# Patient Record
Sex: Female | Born: 1975 | Race: Asian | Hispanic: No | Marital: Married | Smoking: Never smoker
Health system: Southern US, Community
[De-identification: ages and names within clinical notes are randomized; demographics above are authoritative.]

## PROBLEM LIST (undated history)

## (undated) DIAGNOSIS — Q250 Patent ductus arteriosus: Secondary | ICD-10-CM

## (undated) DIAGNOSIS — F419 Anxiety disorder, unspecified: Secondary | ICD-10-CM

## (undated) DIAGNOSIS — E039 Hypothyroidism, unspecified: Secondary | ICD-10-CM

## (undated) DIAGNOSIS — I471 Supraventricular tachycardia, unspecified: Secondary | ICD-10-CM

## (undated) DIAGNOSIS — D649 Anemia, unspecified: Secondary | ICD-10-CM

## (undated) HISTORY — DX: Supraventricular tachycardia: I47.1

## (undated) HISTORY — DX: Hypothyroidism, unspecified: E03.9

## (undated) HISTORY — DX: Anxiety disorder, unspecified: F41.9

## (undated) HISTORY — DX: Supraventricular tachycardia, unspecified: I47.10

## (undated) HISTORY — DX: Patent ductus arteriosus: Q25.0

## (undated) HISTORY — DX: Anemia, unspecified: D64.9

---

## 2006-01-13 ENCOUNTER — Ambulatory Visit (HOSPITAL_COMMUNITY): Admission: RE | Admit: 2006-01-13 | Discharge: 2006-01-13 | Payer: Self-pay | Admitting: Obstetrics & Gynecology

## 2006-03-21 IMAGING — US US OB TRANSVAGINAL
1 series · 13 of 26 positions shown · non-contrast
Comparison: none

CLINICAL DATA: Missed AB.  Evaluate for possible retained products of conception.  The patient has passed tissue and is currently bleeding. 
TRANSVAGINAL OBSTETRICAL US:
TECHNIQUE: Transvaginal ultrasound was performed for evaluation of the gestation as well as the maternal uterus and adnexal regions.

[Series 1: us ob transvaginal · 0.15mm/px · 13 of 26 slices shown]
[im 2/26]
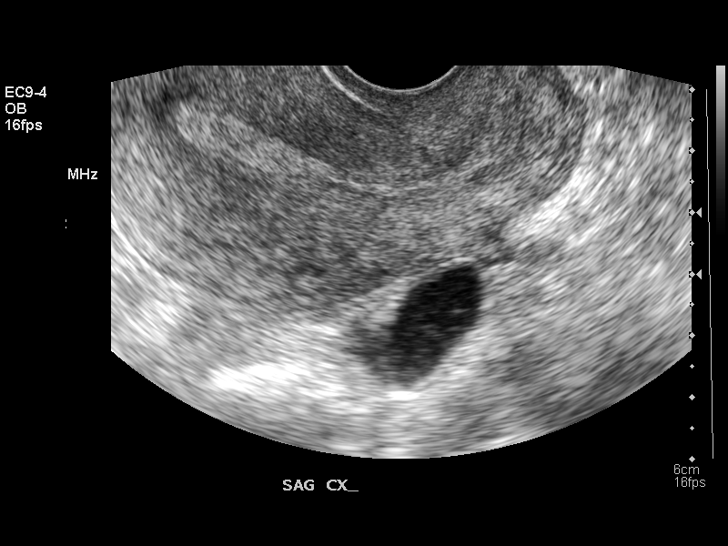
[im 4/26]
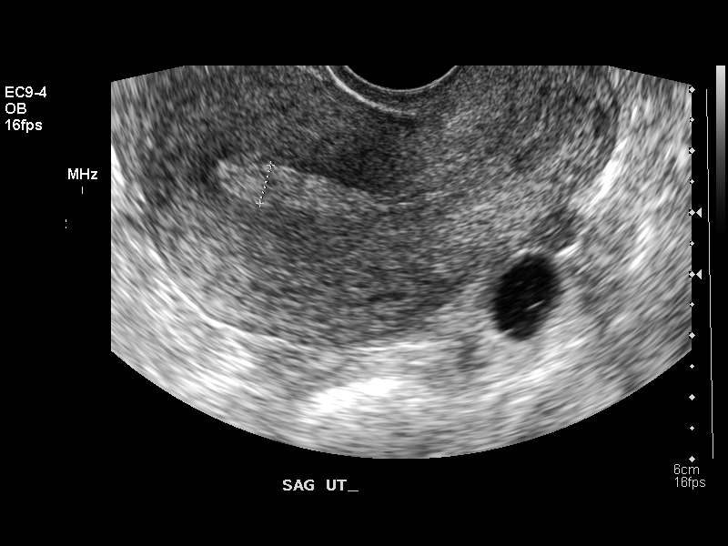
[im 6/26]
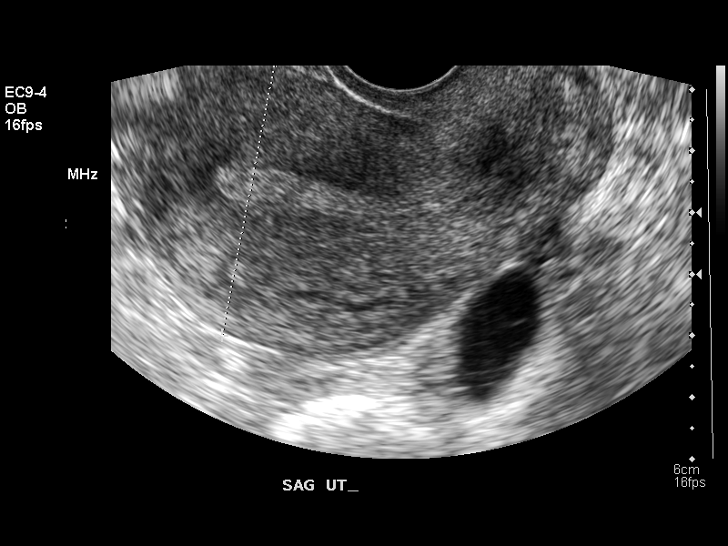
[im 8/26]
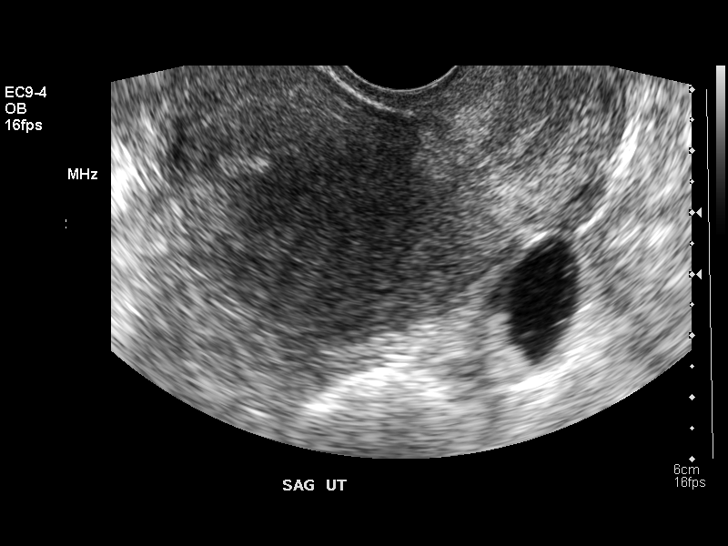
[im 10/26]
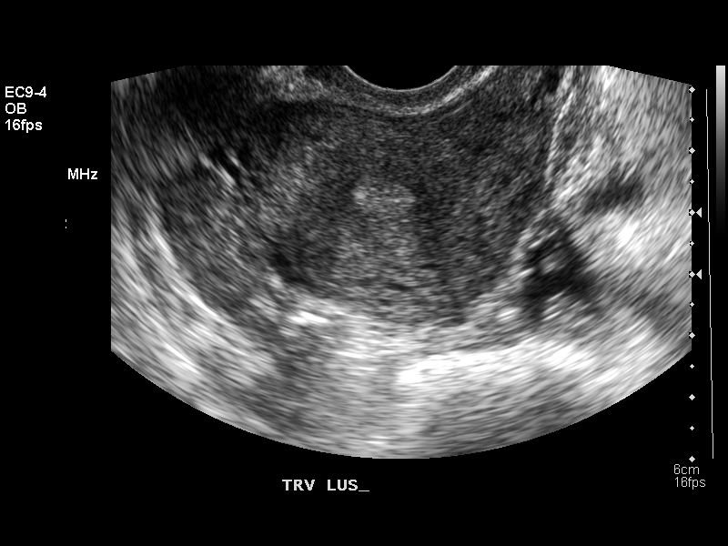
[im 12/26]
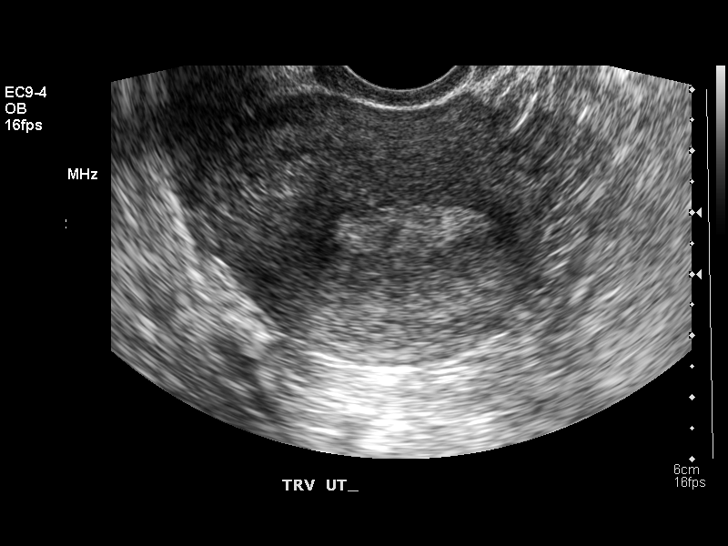
[im 14/26]
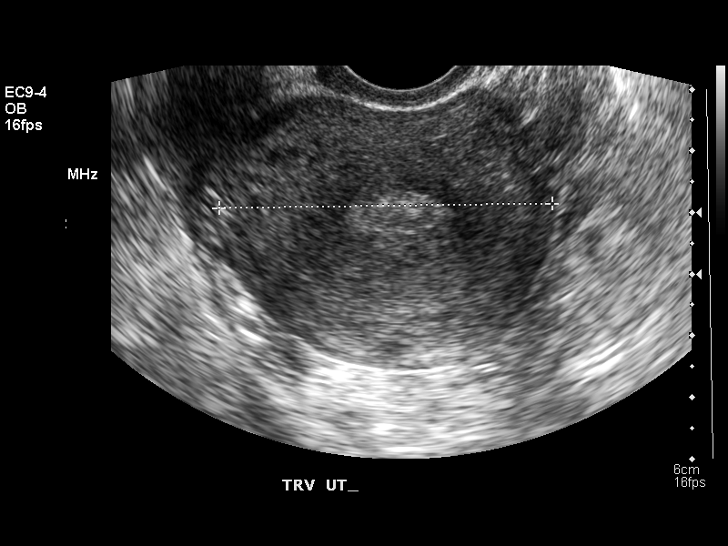
[im 16/26]
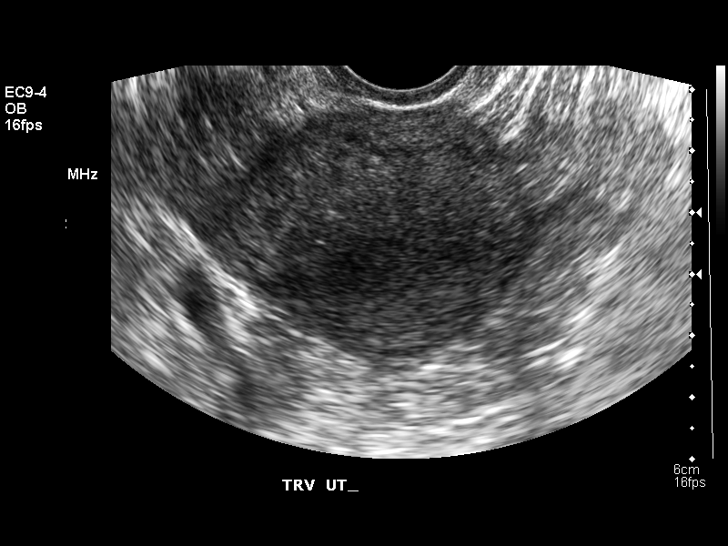
[im 18/26]
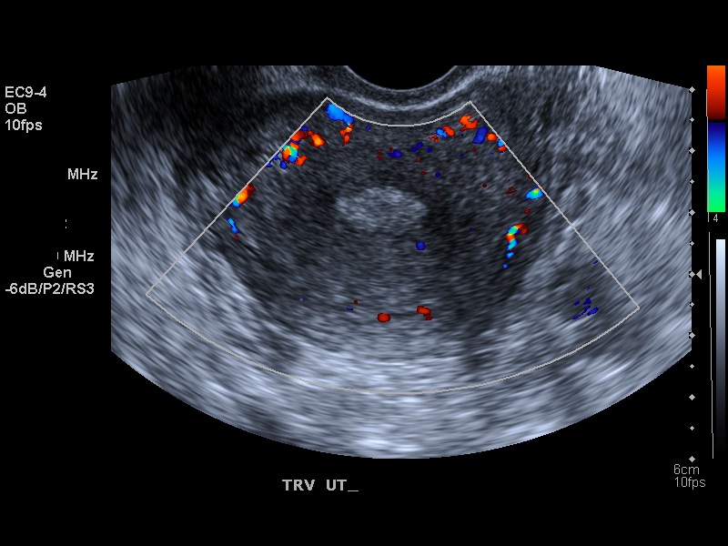
[im 20/26]
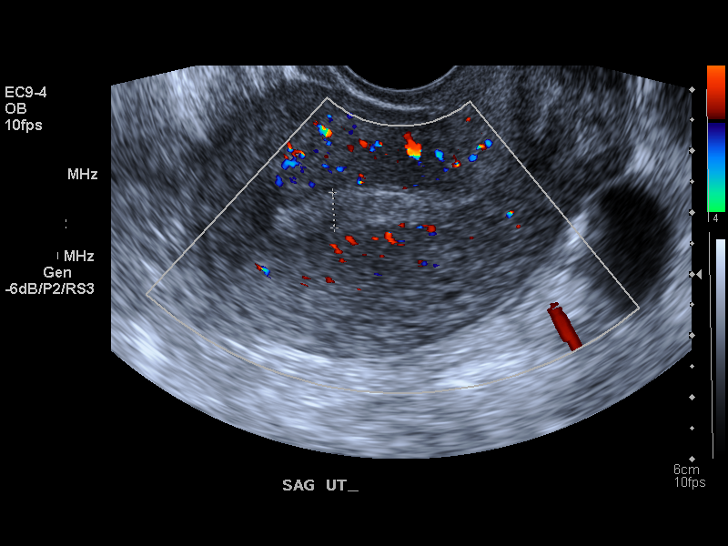
[im 22/26]
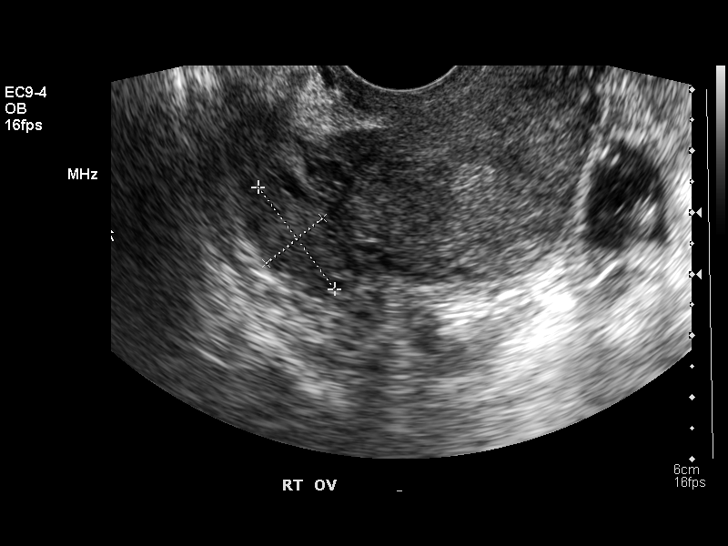
[im 24/26]
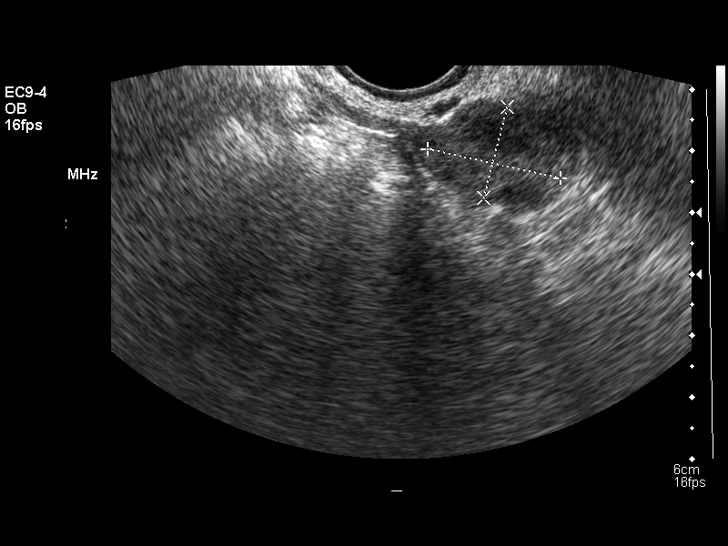
[im 26/26]
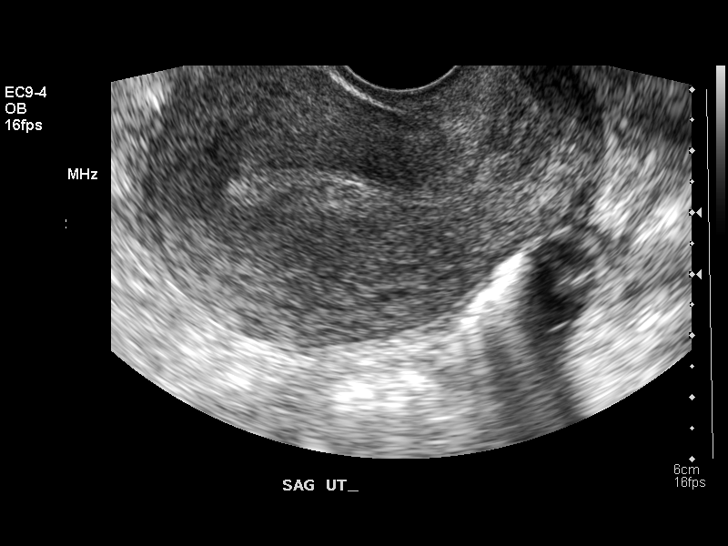

[13 of 26 positions shown; findings below may reference images not displayed]

FINDINGS: Multiple images of the uterus and adnexa were obtained using an endovaginal approach.
The uterus has a sagittal length of 8.3 cm, AP width of 4.6, and a transverse width of 6.0 cm.  A homogeneous uterine myometrium is seen.  The endometrial canal is homogeneous echogenic with an AP width of 5.8 mm.  No evidence for color flow activity was identified within the endometrial canal suggesting that this represents some blood within the endometrial canal in this patient experiencing current bleeding.  No area of focal thickening or inhomogeniety, or abnormal flow was seen to suggest sonographically the presence of retained products of conception.  
Both ovaries are seen with the left ovary measuring 2.2 x 1.5 x 1.6 cm and the right ovary measuring 1.0 x 2.1 x 1.2 cm.  No cul-de-sac or periovarian fluid is seen and no separate adnexal masses are noted.
IMPRESSION: No sonographic stigmata suggestive of retained products of conception.  Normal ovaries.

## 2006-11-19 ENCOUNTER — Inpatient Hospital Stay (HOSPITAL_COMMUNITY): Admission: AD | Admit: 2006-11-19 | Discharge: 2006-11-22 | Payer: Self-pay | Admitting: Obstetrics and Gynecology

## 2007-01-08 ENCOUNTER — Encounter: Admission: RE | Admit: 2007-01-08 | Discharge: 2007-01-08 | Payer: Self-pay | Admitting: Obstetrics and Gynecology

## 2007-03-06 ENCOUNTER — Emergency Department (HOSPITAL_COMMUNITY): Admission: EM | Admit: 2007-03-06 | Discharge: 2007-03-06 | Payer: Self-pay | Admitting: Emergency Medicine

## 2007-03-16 IMAGING — US US EXTREM LOW VENOUS*L*
1 series · 14 of 24 positions shown · non-contrast
Comparison: none

CLINICAL DATA: Left leg pain and swelling, fell 1 week ago.

 Left  lower extremity venous Doppler ultrasound:
TECHNIQUE: Gray-scale sonography with compression as well as color and duplex
Doppler ultrasound were performed to evaluate the deep venous system from the
level of the common femoral vein through the popliteal and proximal calf veins.

[Series 1: unknown · 14 of 34 slices shown]
[im 1/34]
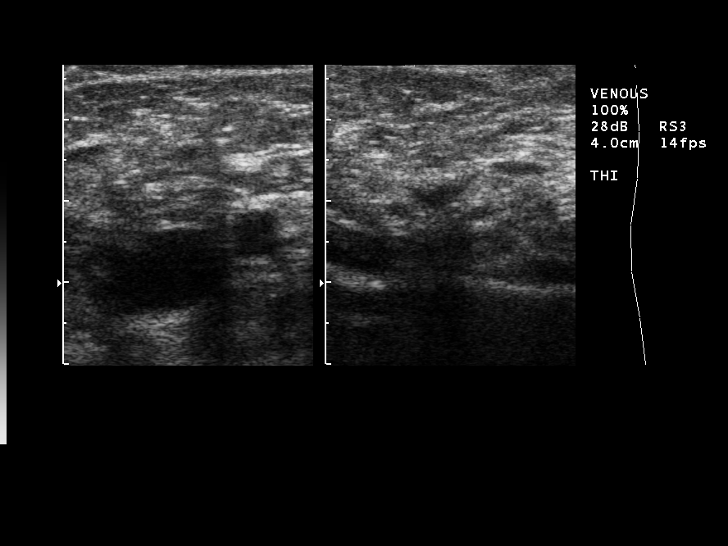
[im 3/34]
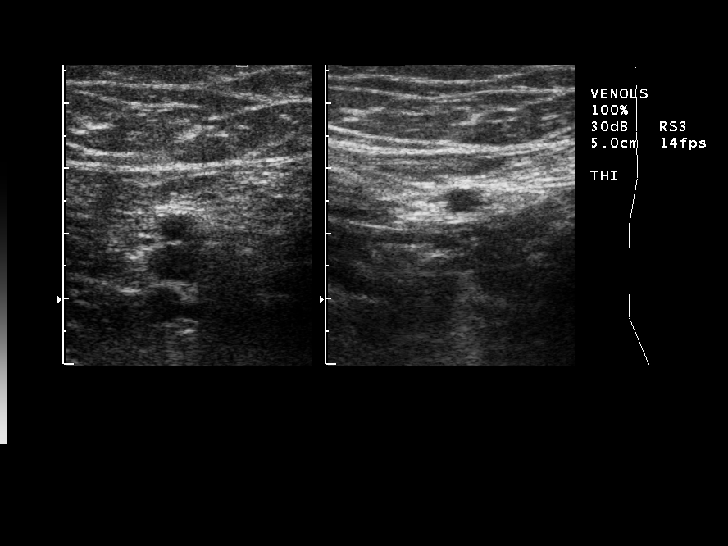
[im 6/34]
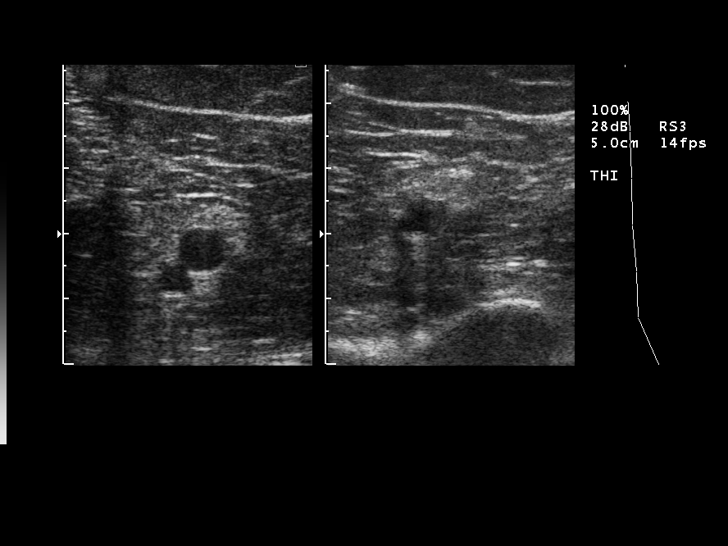
[im 9/34]
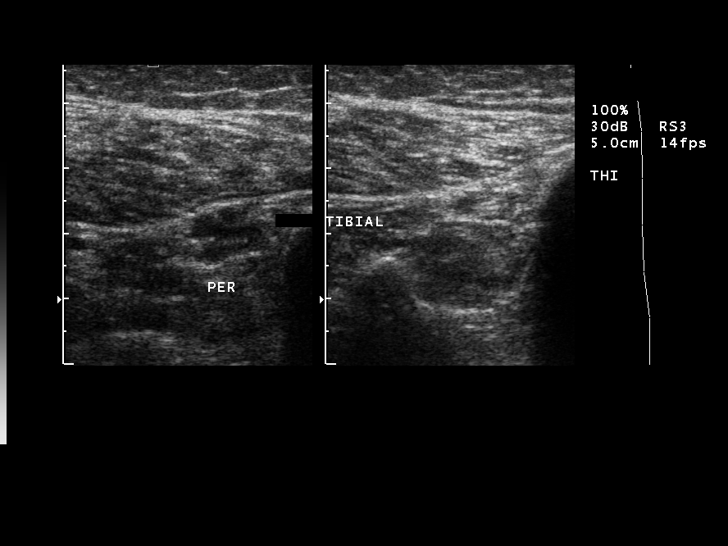
[im 11/34]
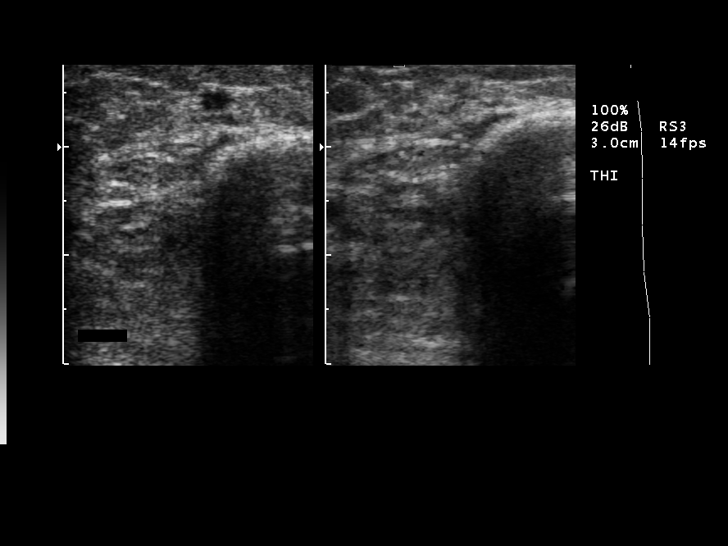
[im 13/34]
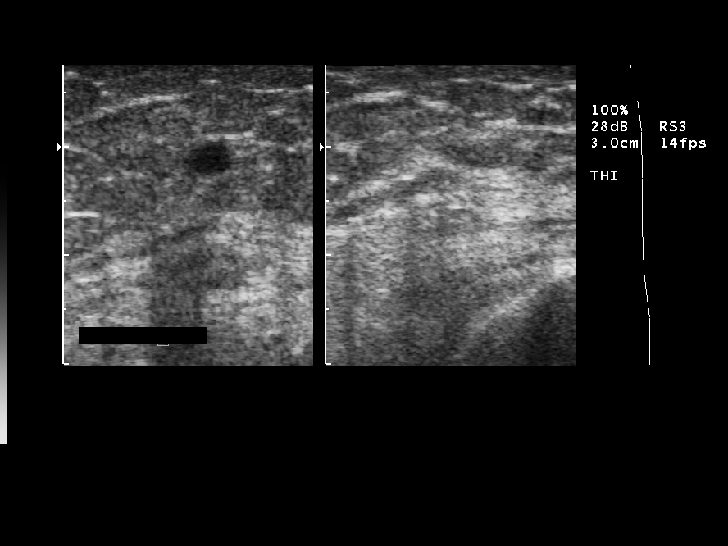
[im 16/34]
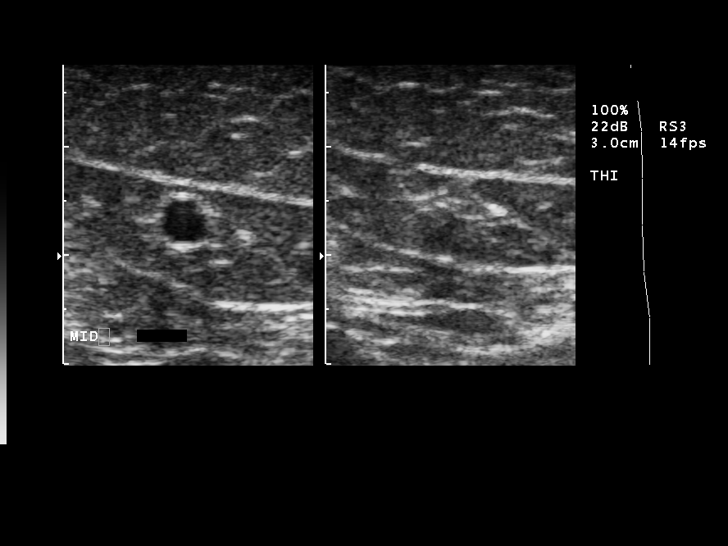
[im 18/34]
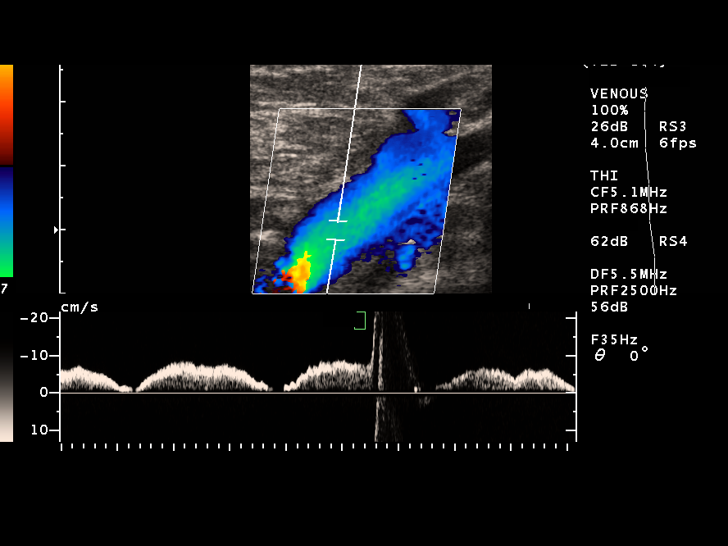
[im 21/34]
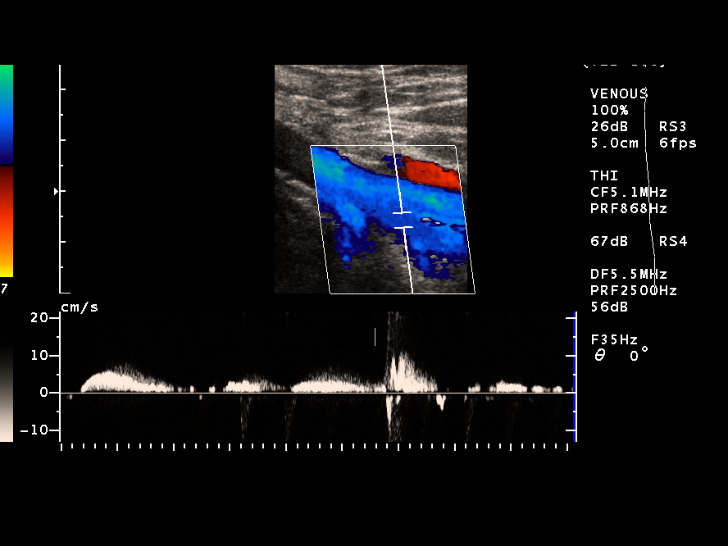
[im 23/34]
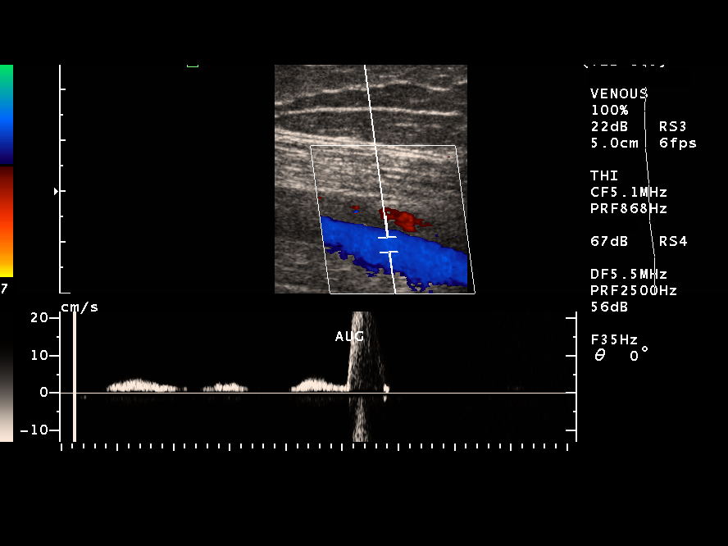
[im 26/34]
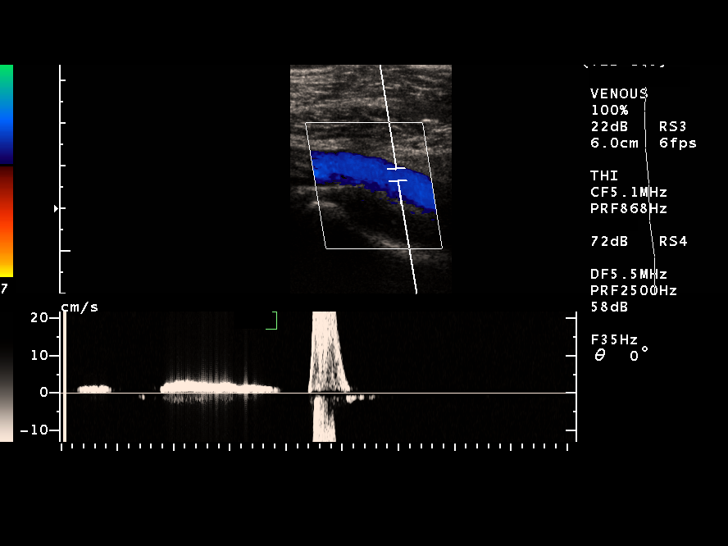
[im 28/34]
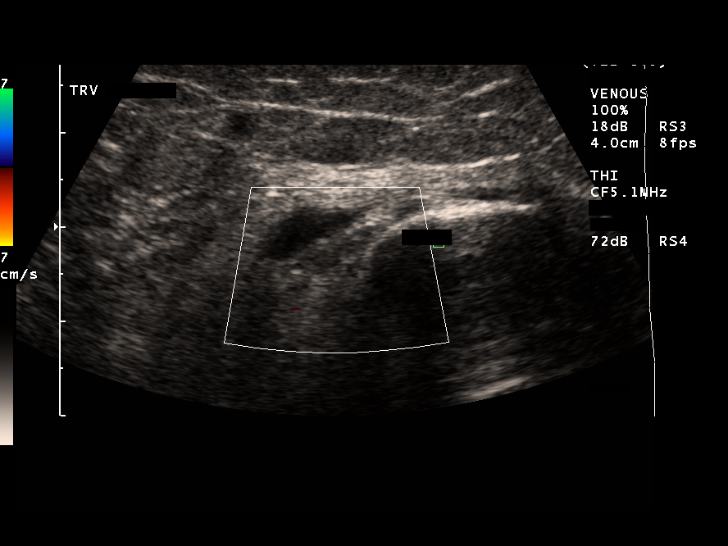
[im 31/34]
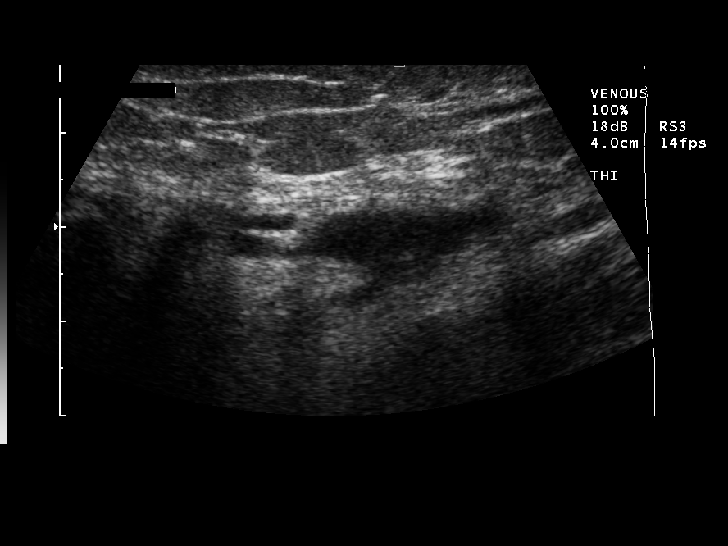
[im 34/34]
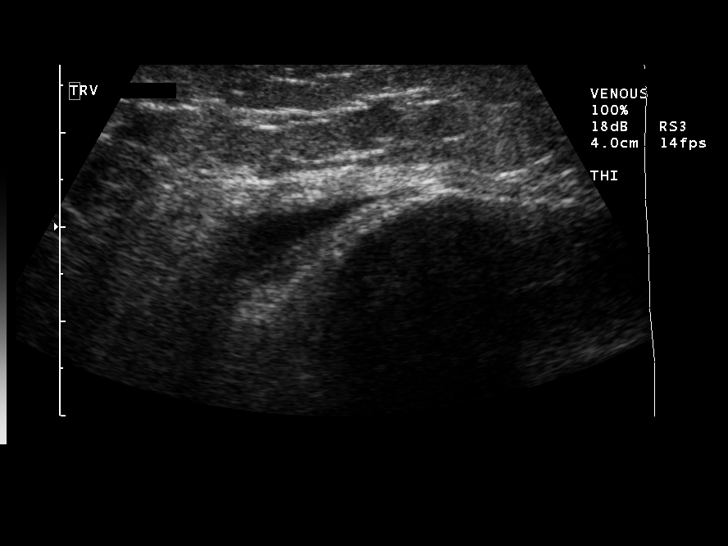

[14 of 24 positions shown; findings below may reference images not displayed]

FINDINGS: The lower extremity deep venous system demonstrates normal
compressibility, phasicity, and augmentation. Posterior tibial vein
unremarkable.  No evidence of DVT. There is a deep subcutaneous 9 x 10 x 33 mm
fluid collection anteromedial to the tibial shaft in the region of patient's
symptoms.
IMPRESSION: 1. Negative for left  lower extremity DVT.
2. Elongated fluid collection in the subcutaneous tissues anteromedial to the
tibial shaft, possibly related to previous fall.

## 2008-12-02 DIAGNOSIS — Q250 Patent ductus arteriosus: Secondary | ICD-10-CM

## 2008-12-02 HISTORY — DX: Patent ductus arteriosus: Q25.0

## 2009-04-01 ENCOUNTER — Inpatient Hospital Stay (HOSPITAL_COMMUNITY): Admission: AD | Admit: 2009-04-01 | Discharge: 2009-04-01 | Payer: Self-pay | Admitting: Obstetrics & Gynecology

## 2009-04-03 ENCOUNTER — Inpatient Hospital Stay (HOSPITAL_COMMUNITY): Admission: AD | Admit: 2009-04-03 | Discharge: 2009-04-05 | Payer: Self-pay | Admitting: Obstetrics and Gynecology

## 2009-05-03 ENCOUNTER — Encounter: Payer: Self-pay | Admitting: Internal Medicine

## 2009-06-01 ENCOUNTER — Encounter: Payer: Self-pay | Admitting: Internal Medicine

## 2009-06-07 IMAGING — US US FETAL BPP W/O NONSTRESS
1 series · 14 of 19 positions shown · non-contrast
Comparison: none

OBSTETRICAL ULTRASOUND:
 This ultrasound exam was performed in the [HOSPITAL] Ultrasound Department.  The OB US report was generated in the AS system, and faxed to the ordering physician.  This report is also available in [REDACTED] PACS.

[Series 1: us fetal bpp w/o nonstress · non-contrast · 19 acquisitions, 14 frames shown]
[im 1/19]
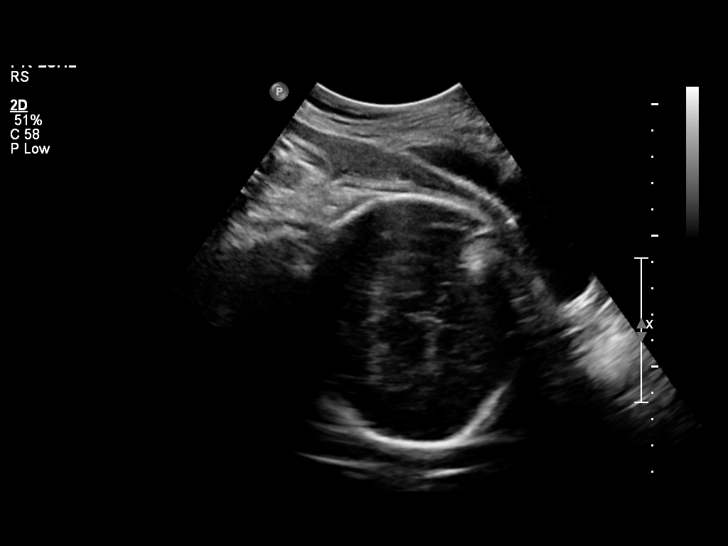
[im 3/19]
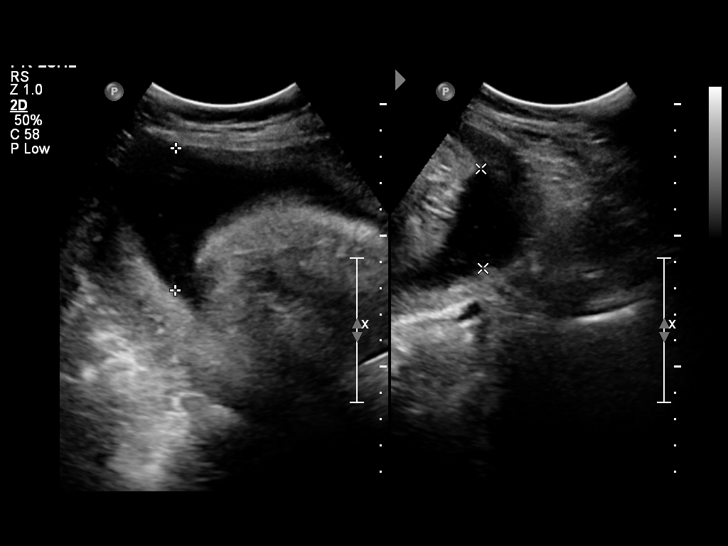
[im 4/19]
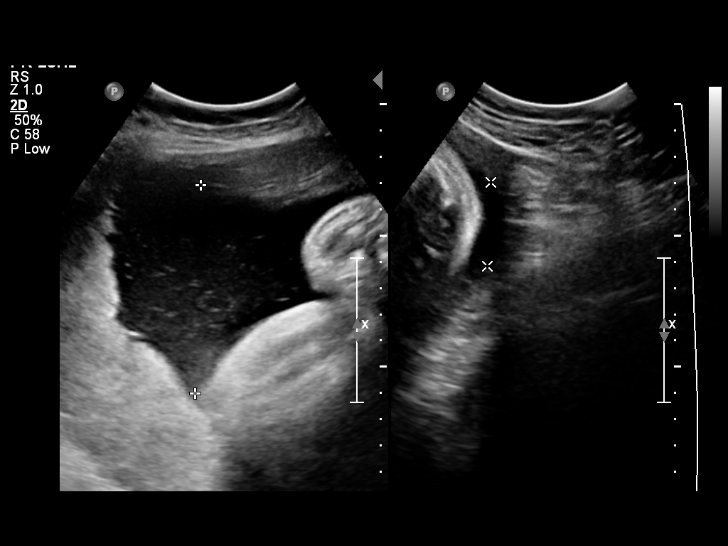
[im 5/19]
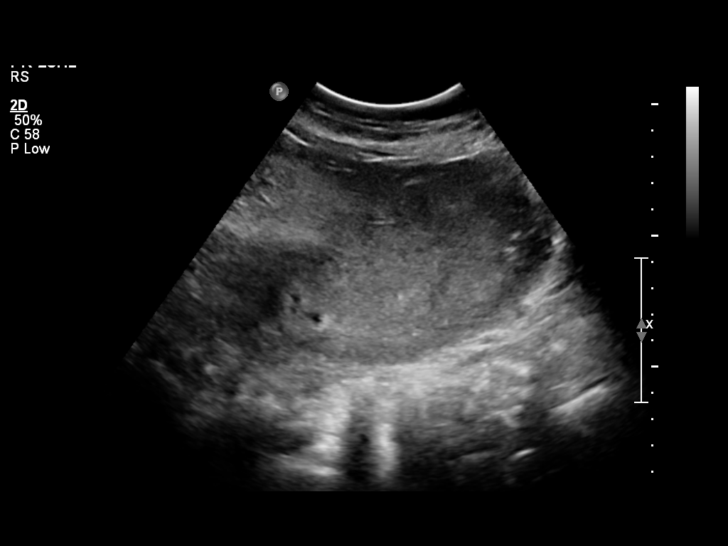
[im 7/19]
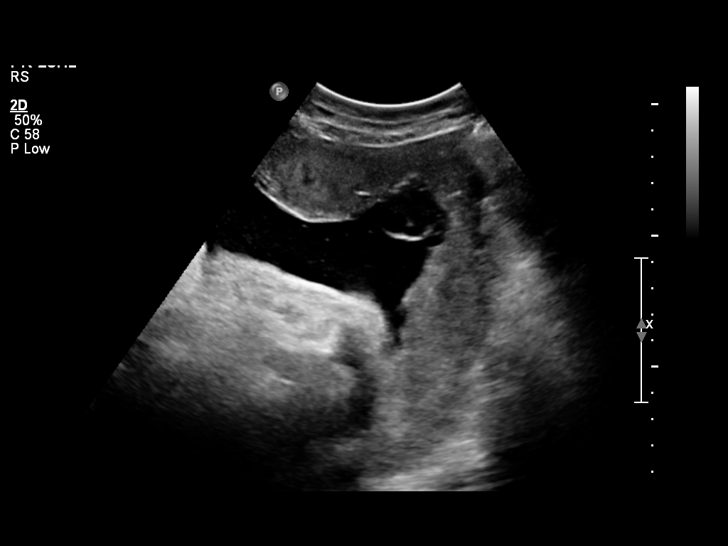
[im 8/19]
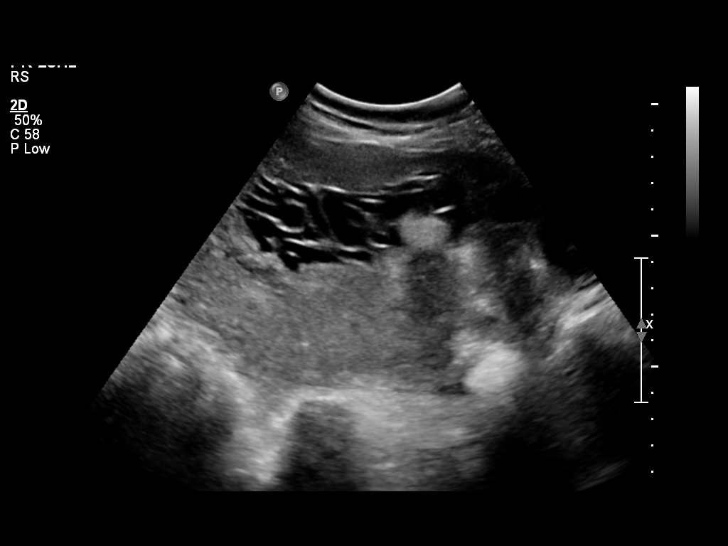
[im 9/19]
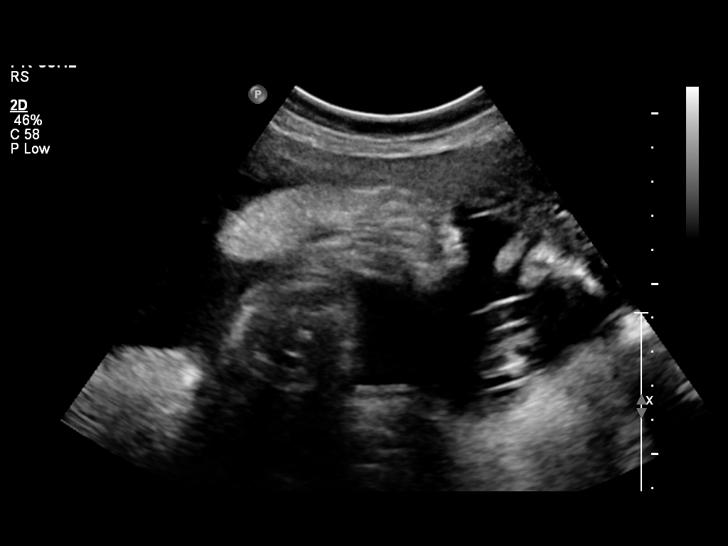
[im 11/19]
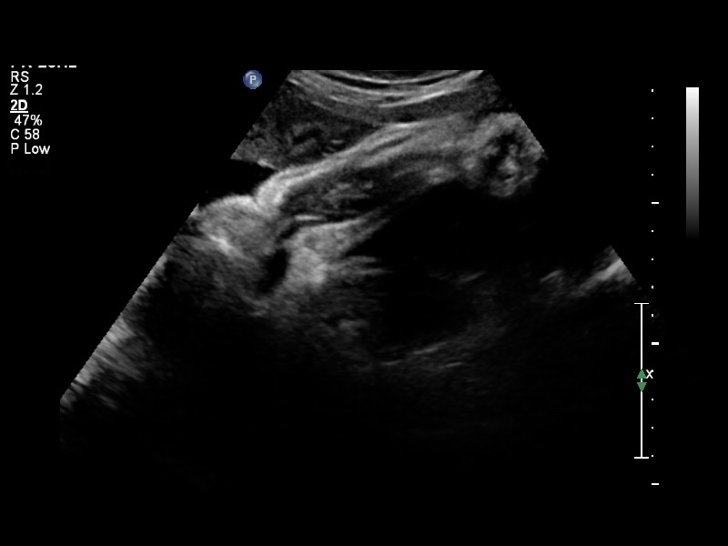
[im 12/19]
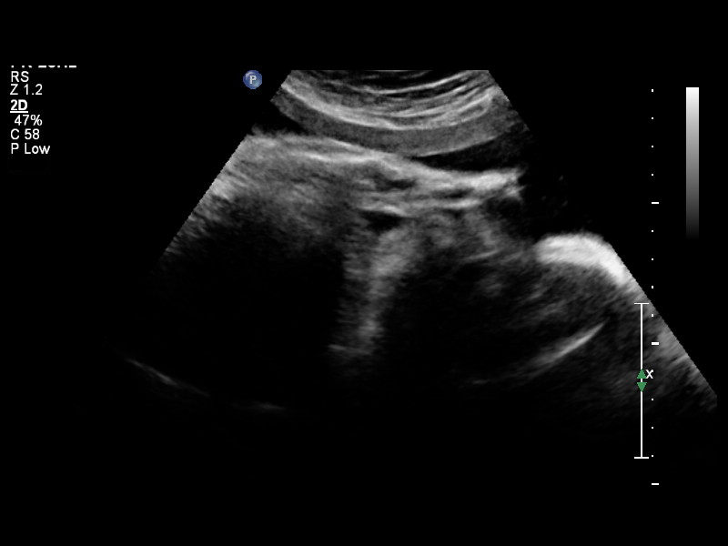
[im 13/19]
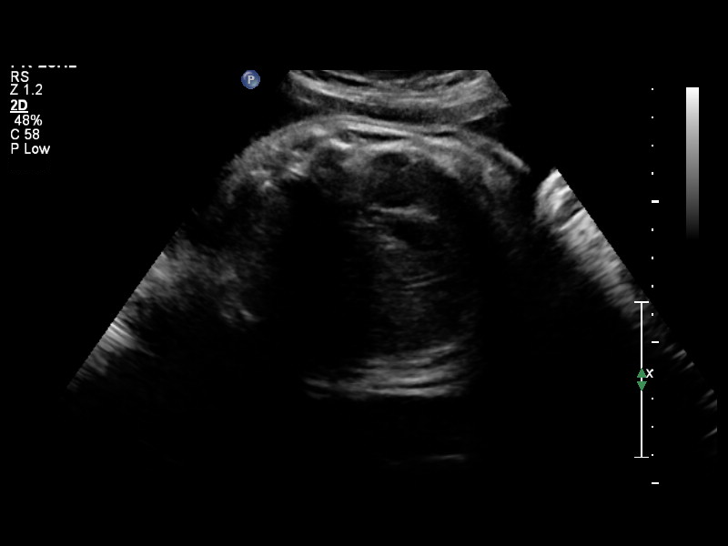
[im 15/19]
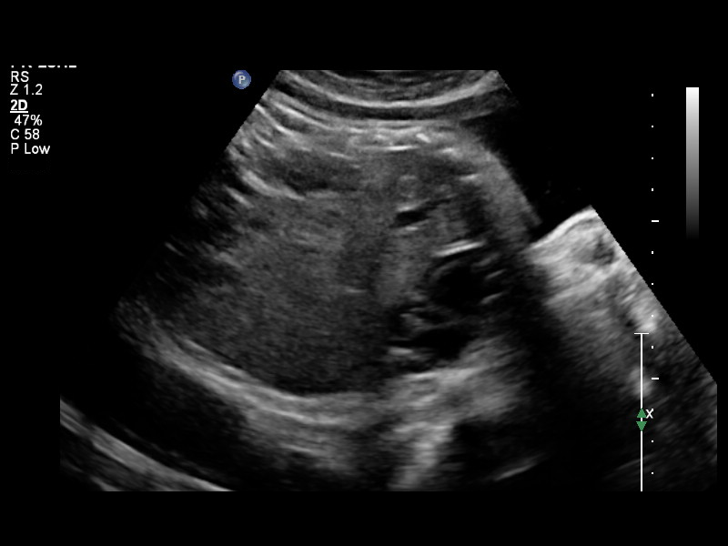
[im 16/19]
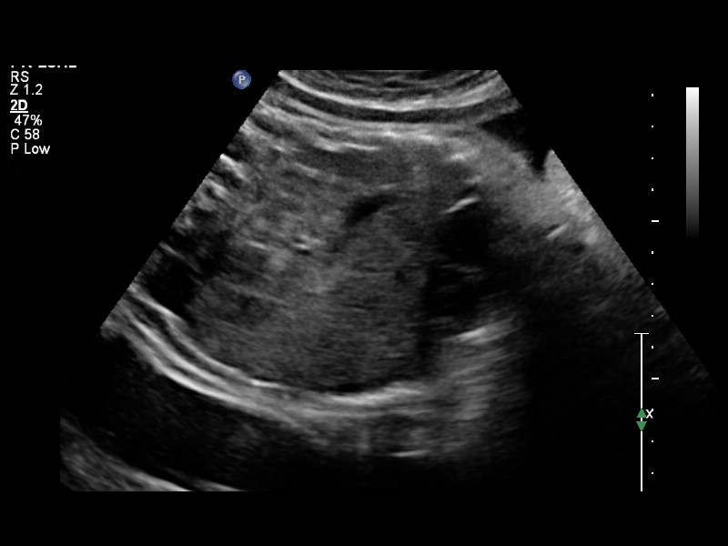
[im 17/19]
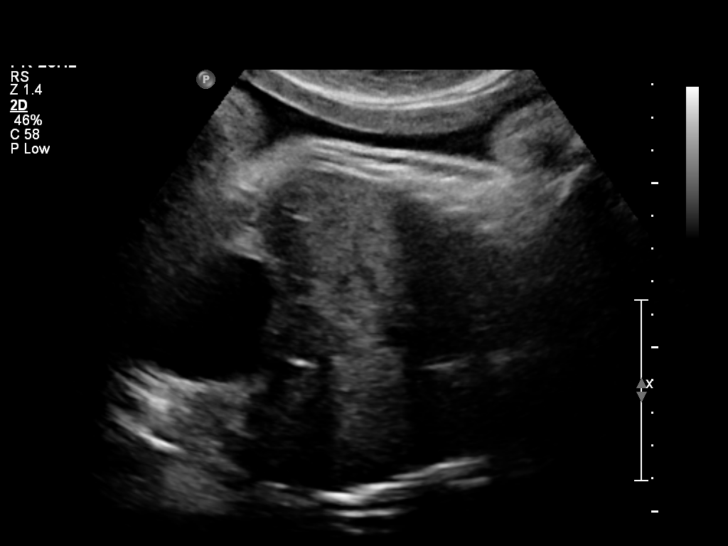
[im 19/19]
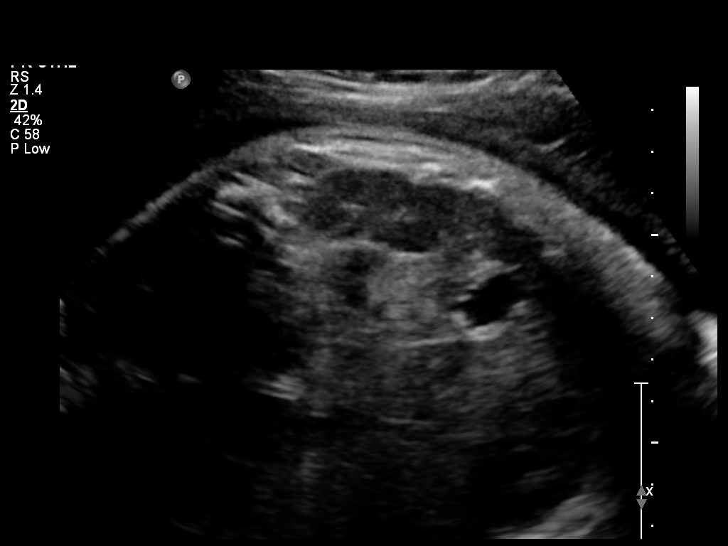

[14 of 19 positions shown; findings below may reference images not displayed]

IMPRESSION: See AS Obstetric US report.

## 2009-11-15 ENCOUNTER — Encounter: Payer: Self-pay | Admitting: Internal Medicine

## 2009-12-18 DIAGNOSIS — I498 Other specified cardiac arrhythmias: Secondary | ICD-10-CM

## 2009-12-18 DIAGNOSIS — Q250 Patent ductus arteriosus: Secondary | ICD-10-CM

## 2009-12-19 ENCOUNTER — Ambulatory Visit: Payer: Self-pay | Admitting: Internal Medicine

## 2009-12-19 DIAGNOSIS — E0789 Other specified disorders of thyroid: Secondary | ICD-10-CM | POA: Insufficient documentation

## 2010-04-26 ENCOUNTER — Telehealth (INDEPENDENT_AMBULATORY_CARE_PROVIDER_SITE_OTHER): Payer: Self-pay | Admitting: *Deleted

## 2010-05-09 ENCOUNTER — Telehealth: Payer: Self-pay | Admitting: Internal Medicine

## 2010-05-31 ENCOUNTER — Ambulatory Visit: Payer: Self-pay | Admitting: Internal Medicine

## 2010-08-31 ENCOUNTER — Ambulatory Visit: Payer: Self-pay | Admitting: Internal Medicine

## 2010-09-17 ENCOUNTER — Telehealth: Payer: Self-pay | Admitting: Internal Medicine

## 2010-09-19 ENCOUNTER — Telehealth: Payer: Self-pay | Admitting: Internal Medicine

## 2010-11-27 ENCOUNTER — Encounter: Payer: Self-pay | Admitting: Internal Medicine

## 2010-11-27 ENCOUNTER — Ambulatory Visit: Payer: Self-pay | Admitting: Internal Medicine

## 2010-12-24 ENCOUNTER — Encounter: Payer: Self-pay | Admitting: Interventional Cardiology

## 2011-01-01 NOTE — Progress Notes (Signed)
Summary: pt has been having pappitations and SVT  Phone Note Call from Patient Call back at Home Phone [phone]   Caller: Spouse Reason for Call: Talk to Nurse, Talk to Doctor Summary of Call: pt has been having palpitations and SVT and wants a sooner appt. He also wants to change to Dr. Graciela Husbands. Initial call taken by: Omer Jack,  Apr 26, 2010 1:53 PM  Follow-up for Phone Call        Called pt and spoke with husband.  She had an episode Mon.  Lasted 2 min.  Nothing like prior events.  Original appointment was made for Dr Graciela Husbands and they want to see him.  He was not avaliable then.  I let them know I would foward to Dr Odessa Fleming nurse and she will give them a call back if this is possible Dennis Bast, RN, BSN  Apr 26, 2010 5:12 PM  Additional Follow-up for Phone Call Additional follow up Details #1::        Sent to schedulers, ok to schedule with Graciela Husbands. Gypsy Balsam RN BSN  May 01, 2010 8:20 AM   appt 7/5 @ 12:00 with Graciela Husbands sent appt card Asher Muir Little  May 01, 2010 9:51 AM

## 2011-01-01 NOTE — Letter (Signed)
[facility]  [facility] Progress Note   Imported By: Roderic Ovens 12/22/2009 10:47:15  _____________________________________________________________________  External Attachment:    Type:   Image     Comment:   External Document

## 2011-01-01 NOTE — Assessment & Plan Note (Signed)
Summary: ec6/ok per amber/jml   Primary Provider:  Talmage Nap, MD   History of Present Illness: Katie Howard is referred today by Dr. 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  Katie Howard has a h/o thyroid dysfunction and is currently on thyroid replacement.    Katie Howard initial episode of SVT occurred approximately 3 yrs ago after Katie birth of Katie Howard. Katie Howard heartrate was apparently 240 beats per minute and it terminated abruptly prior to being given any medication. Katie Howard was I think then put on labetalol which he has tolerated poorly because of fatigue.  Since then Katie Howard has had 2 episodes which are similar to Katie initial period they occurred most recently about a year ago with 2 episodes in a month.  Katie Howard also however has had multiple other episodes associated with similar but distinct symptoms. Katie similarities include pain in Katie Howard and Katie Howard. Katie Howard is however with these other episodes not presyncopal and does not have shortness of breath. Katie Howard feels like Katie Howard heart rate with these other episodes is approximately 120. They frequently occur at night.  Both Katie Howard and Katie Howard husband are very expressive of Katie degree of anxiety that is part of their daily life. This is particularly aggravated when Katie husband goes on trips as his wife does not drive and Katie Howard is poor.  these episodes are fraught with negative diuretic negative.  Current Medications (verified): 1)  Labetalol Hcl 100 Mg Tabs (Labetalol Hcl) .Marland Kitchen.. 1 By Mouth Daily 2)  Armour Thyroid 60 Mg Tabs (Thyroid) .Marland Kitchen.. 1 By Mouth Daily 3)  Ferrous Sulfate 325 (65 Fe) Mg Tabs (Ferrous Sulfate) .... Take One Tablet By Mouth Once Daily. 4)  Armour Thyroid 15 Mg Tabs (Thyroid) .Marland Kitchen.. 1 By Mouth Dialy With 60mg   Allergies (verified): 1)  ! Sulfa  Past History:  Past Medical History: SVT Patent ductus arteriosus suspected per echocardiogram 2010 unspecified anemia hypothyroidism anxiety  Review of Systems       full review of systems was  negative apart from a history of present illness and past medical history.   Vital Signs:  Howard profile:   35 year old female Height:      65 inches Weight:      143 pounds BMI:     23.88 Pulse rate:   80 / minute Resp:     16 per minute BP sitting:   95 / 69  (right arm)  Vitals Entered By: Marrion Coy, CNA (May 31, 2010 9:25 AM)  Physical Exam  General:  Well developed, well nourished, in no acute distress.  HEENT: normal Neck: supple. No JVD. Carotids 2+ bilaterally no bruits Cor: RRR no rubs, gallops or murmur Lungs: CTA Ab: soft, nontender. nondistended. No HSM. Good bowel sounds Ext: warm. no cyanosis, clubbing or edema Neuro: alert and oriented. Grossly nonfocal. affect pleasant    EKG  Procedure date:  05/31/2010  Findings:      sinus rhythm at 80 Intervals 0.16/2007/0.38 Axis is 110 Poor R-wave progression No evidence of preexcitation  Impression & Recommendations:  Problem # 1:  SUPRAVENTRICULAR TACHYCARDIA (ICD-427.89) Katie Howard has documented supraventricular tachycardia at a cycle length of approximately 260 ms. There is no evidence of pseudo-R. prime or pseudo-Q waves on Katie tracing; this and Katie Howard symptoms are most consistent then with AV reentry notwithstanding demographics.  We discussed treatment options including medications and or catheter ablation. We discussed Katie risks of Katie latter; Katie Howard would like to defer this at present.  we then  came to Katie idea of using Rythmol; we discussed potential antiarrhythmic benefits as well as pro arrhythmic risk  He has not tolerated labetalol well because of fatigue; hopefully that'll pass known will be better. It is less likely I think to cause hypotension which may be contributing to Katie Howard fatigue.  Katie Howard also has palpitations which I suspect are not SVT and may represent panic disorder. There have been discussions about SSRI therapy. I have encouraged Katie Howard to pursue this. 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  Start Rhythmol (Propafenone) 150mg  1 tablet twice daily. 2)  Your physician recommends that you schedule a follow-up appointment in: 3 months with Dr Graciela Husbands. Prescriptions: PROPAFENONE HCL 150 MG TABS (PROPAFENONE HCL) Take 1 tablet by mouth two times a day  #60 x 3   Entered by:   Optometrist BSN   Authorized by:   Nathen May, MD, [facility]   Signed by:   Gypsy Balsam RN BSN on 05/31/2010   Method used:   Electronically to        [id number]* (retail)       [address]       [place], [place]  [address]       Ph: [phone]       Fax: [phone]   RxID:   [id number]

## 2011-01-01 NOTE — Progress Notes (Signed)
Summary: calling regarding meds  Phone Note Call from Patient Call back at Home Phone [phone]   Caller: Patient Reason for Call: Insurance Question Summary of Call: Pt calling back regarding medications Initial call taken by: Judie Grieve,  September 19, 2010 10:46 AM  Follow-up for Phone Call        we can try her on diltiazem as she tries to decide what to do about catherter ablaton Follow-up by: Nathen May, MD, [facility],  September 19, 2010 1:52 PM     Appended Document: calling regarding meds pt husband states that they will wait on ablation till their kids get a little older. she wants to go back on  propafenone for now.  they come back in december and will discuss if any changes need made then. Claris Gladden, RN, BSN    Appended Document: Med/Alg Import    Medications Added PROPAFENONE HCL 150 MG TABS (PROPAFENONE HCL) two times a day

## 2011-01-01 NOTE — Progress Notes (Signed)
Summary: SVT  Phone Note Call from Patient Call back at Home Phone [phone]   Caller: Spouse Reason for Call: Talk to Nurse Summary of Call: HAVING MORE SVT... REQUEST SOONER APPT Initial call taken by: Migdalia Dk,  May 09, 2010 8:44 AM  Follow-up for Phone Call        Spoke with pt's husband regarding pt. having two episodes of SVT in the last two weeks. The last episode was last night around nidnight. It  lasted about an hour. Pt's husband states when pt. has this episodes she experience an intense pain in both arms and legs. Pt. has an appointment for July 5th at 12:00 noon . Pt. would like to be seen sooner. Ollen Gross, RN, BSN  May 09, 2010 9:06 AM Pt's states the Tyroid blood levels checked a few weeks ego are normal. Also pt. is taken the Labetalol 100 mg twice a day. Pt was offered an appointment with Dr. Ladona Ridgel on June 22nd. Pt.s husband  would like to keep the appointment with Dr. Graciela Husbands on 06/05/10. He would like to be call if any cancelations on the MD scheduled.   Follow-up by: Ollen Gross, RN, BSN,  May 09, 2010 9:31 AM

## 2011-01-01 NOTE — Assessment & Plan Note (Signed)
Summary: NEP/SVT/JML   Visit Type:  Initial Consult Primary Provider:  Talmage Nap, MD   History of Present Illness: Katie Howard is referred today by Dr. 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  The patient has a h/o thyroid dysfunction and is currently on thyroid replacement.  Her initial episode of SVT occurred approximately 3 yrs ago after the birth of her first child.  The patient notes that since then, she has had several episodes which start and stop suddenly, occaisional require visit to the ED and terminate with adenosine.  She has associated sob and dizziness with these but no frank syncope or c/p.  She was recently placed on Labetalol and she thinks that her symptoms are improved.  She has had documented SVT at rates of up to 220/min.  She does not have any obvious pre-excitation.  Current Medications (verified): 1)  Armour Thyroid 60 Mg Tabs (Thyroid) .... Daily 2)  Prenatal Multivit-Iron  Tabs (Prenatal Vit-Fe Sulfate-Fa) .... As Directed 3)  Labetalol Hcl 100 Mg Tabs (Labetalol Hcl) .... Take One Tablet Two Times A Day 4)  Armour Thyroid 15 Mg Tabs (Thyroid) .... Daily 5)  Ferrous Sulfate 325 (65 Fe) Mg Tabs (Ferrous Sulfate) .... Take One Tablet By Mouth Once Daily.  Allergies: 1)  ! Sulfa  Past History:  Past Medical History: Last updated: 12/18/2009 SVT Patent ductus arteriosus suspected per echocardiogram 2010 unspecified anemia hypothyroidism  Family History: No premature CAD  Social History: No tobacco or ETOH  Review of Systems       All systems reviewed and negative except for occaisional arthralgias.  Vital Signs:  Patient profile:   35 year old female Height:      65 inches Weight:      148 pounds BMI:     24.72 Pulse rate:   82 / minute BP sitting:   100 / 76  (left arm)  Vitals Entered By: Laurance Flatten CMA (December 19, 2009 1:26 PM)  Physical Exam  General:  Well developed, well nourished, in no acute distress.  HEENT: normal Neck: supple. No  JVD. Carotids 2+ bilaterally no bruits Cor: RRR no rubs, gallops or murmur Lungs: CTA Ab: soft, nontender. nondistended. No HSM. Good bowel sounds Ext: warm. no cyanosis, clubbing or edema Neuro: alert and oriented. Grossly nonfocal. affect pleasant    EKG  Procedure date:  12/19/2009  Findings:      Normal sinus rhythm with rate of:  82.Insignificant T-wave inversion noted:     Impression & Recommendations:  Problem # 1:  SUPRAVENTRICULAR TACHYCARDIA (ICD-427.89) I have discussed the treatment options with the patient and her husband who is with her today.  She has recurrent symptomatic SVT.  While she has improved with medical therapy, she is still symptomatic.  The risks/benefits/goals/ and expectations of EPS/RFA have been discussed the patient and she will consider her options and call us if she would like to proceed with ablation. Her updated medication list for this problem includes:    Labetalol Hcl 100 Mg Tabs (Labetalol hcl) .Marland Kitchen... 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  Problem # 3:  PATENT DUCTUS ARTERIOSUS (ICD-747.0) I do not think this has an effect on her SVT.

## 2011-01-01 NOTE — Assessment & Plan Note (Signed)
Summary: 3 month rov/sl   Primary Provider:  Talmage Nap, MD  CC:  pt complains of hair falling out and headaches pt states its related to the new medication Propafenone.  History of Present Illness: Katie Howard is seen in followup for  recurrent SVT.  after lengthy discussions, it she had elected to pursue drug therapy as opposed to catheter ablation. We elected to try her on propafenone. It has been poorly tolerated with fatigue and alopecia and emotional mood swings. Her heart rhythm however has been quiet   The patient has a h/o thyroid dysfunction and is currently on thyroid replacement.    I should note that her episodes are frog negative and diuretic negative    Current Medications (verified): 1)  Propafenone Hcl 150 Mg Tabs (Propafenone Hcl) .... Take 1 Tablet By Mouth Two Times A Day 2)  Armour Thyroid 60 Mg Tabs (Thyroid) .Marland Kitchen.. 1 By Mouth Daily 3)  Ferrous Sulfate 325 (65 Fe) Mg Tabs (Ferrous Sulfate) .... Take One Tablet By Mouth Once Daily. 4)  Armour Thyroid 15 Mg Tabs (Thyroid) .Marland Kitchen.. 1 By Mouth Dialy With 60mg   Allergies: 1)  ! Sulfa  Past History:  Past Medical History: Last updated: 05/31/2010 SVT Patent ductus arteriosus suspected per echocardiogram 2010 unspecified anemia hypothyroidism anxiety  Family History: Last updated: 12/19/2009 No premature CAD  Social History: Last updated: 12/19/2009 No tobacco or ETOH  Vital Signs:  Patient profile:   35 year old female Height:      65 inches Weight:      144 pounds BMI:     24.05 Pulse rate:   81 / minute Resp:     16 per minute BP sitting:   95 / 64  (left arm)  Vitals Entered By: Kem Parkinson (August 31, 2010 10:10 AM)  Physical Exam  General:  The patient was alert and oriented in no acute distress. HEENT Normal.  Neck veins were flat, carotids were brisk.  Lungs were clear.  Heart sounds were regular without murmurs or gallops.  Abdomen was soft with active bowel sounds. There is no  clubbing cyanosis or edema. Skin Warm and dry    Impression & Recommendations:  Problem # 1:  SUPRAVENTRICULAR TACHYCARDIA (ICD-427.89)  the patient's tachycardia has been quieted by the Rythmol. However, she has been unable to tolerate it because of side effects (see below). We will discontinue it and try her on flecainide 50 mg twice daily. The following medications were removed from the medication list:    Propafenone Hcl 150 Mg Tabs (Propafenone hcl) .Marland Kitchen... Take 1 tablet by mouth two times a day Her updated medication list for this problem includes:    Flecainide Acetate 50 Mg Tabs (Flecainide acetate) .Marland Kitchen..Marland Kitchen Two times a day  The following medications were removed from the medication list:    Propafenone Hcl 150 Mg Tabs (Propafenone hcl) .Marland Kitchen... Take 1 tablet by mouth two times a day Her updated medication list for this problem includes:    Flecainide Acetate 50 Mg Tabs (Flecainide acetate) .Marland Kitchen..Marland Kitchen Two times a day  Problem # 2:  FATIGUE (ICD-780.79) this may be related to the medication either primarily or because of his blood pressure lowering effects we'll see how she does off of Rythmol  Problem # 3:  ALOPECIA (ICD-704.00) this likely also is  attributable to Rythmol; we will stop the drug  Patient Instructions: 1)  Your physician recommends that you schedule a follow-up appointment in: 3 months 2)  Your physician has recommended you  make the following change in your medication: stop Propafenone, start Flecainide 50 mg, twice a day.  Prescriptions: FLECAINIDE ACETATE 50 MG TABS (FLECAINIDE ACETATE) two times a day  #60 x 6   Entered by:   Claris Gladden RN   Authorized by:   Nathen May, MD, [facility]   Signed by:   Claris Gladden RN on 08/31/2010   Method used:   Electronically to        [facility] [address]* (retail)       [address]       [place], [place]  [address]       Ph: [phone]       Fax: [phone]   RxID:   [id number]

## 2011-01-01 NOTE — Progress Notes (Signed)
Summary: has questions re meds  Phone Note Call from Patient   Caller: Spouse [phone] ram Reason for Call: Talk to Nurse Summary of Call: pt's husband calling re medication, having side effects, has questions Initial call taken by: Glynda Jaeger,  September 17, 2010 11:01 AM  Follow-up for Phone Call        per pt husband-Flecainide causing pounding heart, difficulty breathing. She stopped the med yesterday.  She started taking propafenone this morning again. adv pt that will discuss w/DOD  Follow-up by: Claris Gladden RN,  September 17, 2010 1:51 PM  Additional Follow-up for Phone Call Additional follow up Details #1::        adv pt husband to have pt stop taking propafenone and not start taking felecainide either.  pt husband verbalize understanding. discussed the possiblity of abalzation since not tolerating meds and that will call back later this week after discussing with Dr. Graciela Husbands.  Additional Follow-up by: Claris Gladden RN,  September 17, 2010 2:01 PM    Additional Follow-up for Phone Call Additional follow up Details #2::    agree   let me know what they decide  Follow-up by: Nathen May, MD, [facility],  September 18, 2010 6:14 PM

## 2011-01-03 NOTE — Assessment & Plan Note (Signed)
Summary: 3 month rov/sl   Visit Type:  3 month ov Primary Katie Howard:  Talmage Nap, MD  CC:  headache / Occ.  History of Present Illness: [name] is seen in followup for  recurrent SVT.  after lengthy discussions, it she had elected to pursue drug therapy as opposed to catheter ablation. We elected to try her on propafenone. It has been poorly tolerated with fatigue and alopecia and emotional mood swings. Her heart rhythm however has been quiet, and we decreased the dosage from 3 times-2 times a day and she has tolerated it better. The mood has stabilized. If the fatigue is improved. The alopecia remains albeit to a lesser degree   The patient has a h/o thyroid dysfunction and is currently on thyroid replacement with Armour Thyroid; she previously was unable to tolerate Synthroid  I should note that her episodes are frog negative and diuretic negative    Problems Prior to Update: 1)  Fatigue  (ICD-780.79) 2)  Alopecia  (ICD-704.00) 3)  Other Specified Disorders of Thyroid  (ICD-246.8) 4)  Supraventricular Tachycardia  (ICD-427.89) 5)  Patent Ductus Arteriosus  (ICD-747.0)  Current Medications (verified): 1)  Armour Thyroid 60 Mg Tabs (Thyroid) .Marland Kitchen.. 1 By Mouth Daily 2)  Armour Thyroid 15 Mg Tabs (Thyroid) .Marland Kitchen.. 1 By Mouth Dialy With 60mg  3)  Propafenone Hcl 150 Mg Tabs (Propafenone Hcl) .... Two Times A Day  Allergies (verified): 1)  ! Sulfa  Past History:  Past Medical History: Last updated: 05/31/2010 SVT Patent ductus arteriosus suspected per echocardiogram 2010 unspecified anemia hypothyroidism anxiety  Family History: Last updated: 12/19/2009 No premature CAD  Social History: Last updated: 12/19/2009 No tobacco or ETOH  Vital Signs:  Patient profile:   35 year old female Height:      65 inches Weight:      142 pounds BMI:     23.72 Pulse rate:   88 / minute BP sitting:   98 / 78  (left arm) Cuff size:   regular  Vitals Entered By: Caralee Ates CMA  (November 27, 2010 10:05 AM)  Physical Exam  General:  The patient was alert and oriented in no acute distress. HEENT Normal.  Neck veins were flat, carotids were brisk.  Lungs were clear.  Heart sounds were regular without murmurs or gallops.  Abdomen was soft with active bowel sounds. There is no clubbing cyanosis or edema. Skin Warm and dry    EKG  Procedure date:  11/27/2010  Findings:      sinus rhythm at 89 Intervals 0.13/08/0.36 and poor R-wave progression and anterior T-wave changes  EKG  Procedure date:  11/27/2010  Findings:      these changes are similar to her prior ECG  Impression & Recommendations:  Problem # 1:  SUPRAVENTRICULAR TACHYCARDIA (ICD-427.89) well-controlled currently on Rythmol. She will continue on her current dosing. He is tolerating the degree of alopecia. Her updated medication list for this problem includes:    Propafenone Hcl 150 Mg Tabs (Propafenone hcl) .Marland Kitchen..Marland Kitchen Two times a day  Patient Instructions: 1)  Your physician recommends that you continue on your current medications as directed. Please refer to the Current Medication list given to you today. 2)  Your physician wants you to follow-up in:6 months   You will receive a reminder letter in the mail two months in advance. If you don't receive a letter, please call our office to schedule the follow-up appointment.

## 2011-03-12 LAB — CBC
HCT: 32.1 % — ABNORMAL LOW (ref 36.0–46.0)
Hemoglobin: 11.2 g/dL — ABNORMAL LOW (ref 12.0–15.0)
Platelets: 194 10*3/uL (ref 150–400)
RDW: 14.3 % (ref 11.5–15.5)
RDW: 14.9 % (ref 11.5–15.5)
WBC: 10.1 10*3/uL (ref 4.0–10.5)

## 2011-03-12 LAB — RPR: RPR Ser Ql: NONREACTIVE

## 2011-03-12 LAB — CCBB MATERNAL DONOR DRAW

## 2011-04-16 NOTE — H&P (Signed)
NAMEPORTER, NAKAMA     ACCOUNT NO.:  [id number]   MEDICAL RECORD NO.:  [id number]          PATIENT TYPE:  INP   LOCATION:  9110                          FACILITY:  [facility]   PHYSICIAN:  Lenoard Aden, M.D.DATE OF BIRTH:  1976-01-04   DATE OF ADMISSION:  04/03/2009  DATE OF DISCHARGE:                              HISTORY & PHYSICAL   CHIEF COMPLAINT:  Labor.   She is a 35 year old [place] female G3, P1, at term 22 plus weeks in  active labor.  Denies rupture of membranes.  She is GBS positive.  She  has allergies to SULFA and history of hypothyroidism and SVT.  She is a  nonsmoker, nondrinker, and denies domestic or physical violence.  Medications include prenatal vitamins, thyroid replacement therapy, and  iron.   FAMILY HISTORY:  Arthritis, diabetes, hypertension, and cancer.  She has  a history of 1 SAB in 2007 and a term vacuum-assisted vaginal delivery,  7 pounds 1 ounce female in 2007.   PHYSICAL EXAMINATION:  GENERAL:  She is a uncomfortable-appearing [place]  female in no acute distress.  HEENT:  Normal.  LUNGS:  Clear.  HEART:  Regular rhythm.  ABDOMEN:  Soft, gravid, and nontender.  Estimated fetal weight 7-7-1/2  pounds.  Cervix is 6-7 cm, 100%, and vertex -1.  EXTREMITIES:  No cords.  NEUROLOGIC:  Nonfocal.  SKIN:  Intact.   Artificial rupture of membranes performed clear.  NST reactive.  Contractions every 2-4 minutes.   IMPRESSION:  1. Term intrauterine pregnancy in active labor.  2. Group B streptococcus positive with advanced dilatation.   PLAN:  Ampicillin 2 g IV and then 1 g q.4 h.  Followup epidural as  needed.      Lenoard Aden, M.D.  Electronically Signed     RJT/MEDQ  D:  04/03/2009  T:  04/04/2009  Job:  [id number]

## 2011-04-19 NOTE — Op Note (Signed)
Katie Howard, Katie Howard     ACCOUNT NO.:  [id number]   MEDICAL RECORD NO.:  [id number]          PATIENT TYPE:  INP   LOCATION:  9165                          FACILITY:  [facility]   PHYSICIAN:  Lenoard Aden, M.D.DATE OF BIRTH:  February 02, 1976   DATE OF PROCEDURE:  11/20/2006  DATE OF DISCHARGE:                               OPERATIVE REPORT   INDICATIONS FOR OPERATIVE DELIVERY:  Fetal tachycardia, prolonged second  stage.   PROCEDURE:  Outlet vacuum-assisted vaginal delivery.   SURGEON:  Lenoard Aden, M.D.   ANESTHESIA:  Epidural.   ESTIMATED BLOOD LOSS:  500 cc.   COMPLICATIONS:  None.   DRAINS:  None.   COUNTS:  Correct.   DISPOSITION:  The patient was taken to recovery in good condition.   DESCRIPTION OF PROCEDURE:  Being apprised of the risks of vacuum  assistance, including __________  hematoma, scalp laceration,  intracranial hemorrhage, Kiwi cup was placed at the fetal vertex, +3  station, straight OA, for 3 pulls over midline episiotomy with left  lateral extension. Full-term living female, Apgars 8 and 9.  Cord pH and  cord blood collected.  The placenta remained intact with a 3-vessel cord  noted.  Cervix without laceration.  A left sulcus tear was repaired with  a 3-0 Monocryl, and the episiotomy was then prepared in the standard  fashion using a 3-0 Monocryl and a 3-0 Rapide.  Estimated blood loss was  500 cc.  The patient tolerated the procedure well and was taken to  recovery room in good condition.      Lenoard Aden, M.D.  Electronically Signed     RJT/MEDQ  D:  11/20/2006  T:  11/20/2006  Job:  [id number]

## 2011-05-09 ENCOUNTER — Encounter: Payer: Self-pay | Admitting: Internal Medicine

## 2011-06-18 ENCOUNTER — Encounter: Payer: Self-pay | Admitting: Internal Medicine

## 2011-06-18 ENCOUNTER — Ambulatory Visit (INDEPENDENT_AMBULATORY_CARE_PROVIDER_SITE_OTHER): Payer: BC Managed Care – PPO | Admitting: Internal Medicine

## 2011-06-18 VITALS — BP 100/66 | HR 89 | Ht 65.0 in | Wt 145.0 lb

## 2011-06-18 DIAGNOSIS — Q250 Patent ductus arteriosus: Secondary | ICD-10-CM

## 2011-06-18 DIAGNOSIS — I498 Other specified cardiac arrhythmias: Secondary | ICD-10-CM

## 2011-06-18 NOTE — Progress Notes (Signed)
  HPI  Katie Howard is a 35 y.o. female seen in followup for recurrent SVT.    after lengthy discussions, it she had elected to pursue drug therapy as opposed to catheter ablation. We elected to try her on propafenone And gradually settled it on 2 times a day therapy which she has tolerated very well without recurrent tachypalpitations. Alopecia continues to be a problem low to a lesser degree. The patient has a h/o thyroid dysfunction and is currently on thyroid replacement with Armour Thyroid; she previously was unable to tolerate Synthroid  I should note that her episodes are frog negative and diuretic negative   Past Medical History  Diagnosis Date  . SVT (supraventricular tachycardia)   . Anemia     Unspecified  . Hypothyroidism   . Anxiety   . Patent ductus arteriosus 2010    suspected per echocardiogram    No past surgical history on file.  Current Outpatient Prescriptions  Medication Sig Dispense Refill  . propafenone (RYTHMOL) 150 MG tablet Take 150 mg by mouth 2 (two) times daily.        Marland Kitchen thyroid (ARMOUR) 15 MG tablet Take 15 mg by mouth daily.        Marland Kitchen thyroid (ARMOUR) 60 MG tablet Take 60 mg by mouth daily.          Allergies  Allergen Reactions  . Sulfonamide Derivatives     Review of Systems negative except from HPI and PMH  Physical Exam Well developed and well nourished in no acute distress HENT normal E scleral and icterus clear Neck Supple JVP flat; carotids brisk and full Clear to ausculation Regular rate and rhythm, no murmurs gallops or rub Soft with active bowel sounds No clubbing cyanosis and edema Alert and oriented, grossly normal motor and sensory function Skin Warm and Dry  ECG Sinus rhythm at 89 Intervals 0.14/08/0.36 T-Wave inversions V2-V4  Assessment and  Plan

## 2011-06-18 NOTE — Assessment & Plan Note (Addendum)
We spent a long time( >.50% of 30 min visit discussing alternatives to the Rythmol. These included catheter ablation which she again deferred. We also discussed the possibility of dronaderone. We reviewed extensively the risks and benefits extrapolated from atrial fibrillation trials. For right now the family will continue on Rythmol. The hope is that some of the alopecia is related to the stress of having 2 children at home and we'll see if that doesn't resolve saw him when the children return to school

## 2011-06-18 NOTE — Patient Instructions (Signed)
Your physician recommends that you schedule a follow-up appointment in: 4 months.  Your physician recommends that you continue on your current medications as directed. Please refer to the Current Medication list given to you today.  

## 2011-06-26 ENCOUNTER — Telehealth: Payer: Self-pay | Admitting: *Deleted

## 2011-06-26 DIAGNOSIS — Q250 Patent ductus arteriosus: Secondary | ICD-10-CM

## 2011-06-26 NOTE — Telephone Encounter (Signed)
Per Dr. Graciela Husbands, the patient needs an echo to r/o PDA. He has spoken with the patient and her husband. I have left a message for her to call so I can schedule her for this.

## 2011-06-28 NOTE — Telephone Encounter (Signed)
Per pt husband call, pt will take any available appt as soon as possible. Please return pt call.

## 2011-06-28 NOTE — Telephone Encounter (Signed)
Spoke with pt's husband. He would like to have echo scheduled ASAP. Echo order is in. Patient's husband is aware that scheduler will call next week for date and time of test. Husband verbalized understanding

## 2011-07-01 NOTE — Telephone Encounter (Signed)
I spoke with the patient's husband. The patient is scheduled for her echo tomorrow at 8:30am. He verbalizes understanding.

## 2011-07-02 ENCOUNTER — Ambulatory Visit (HOSPITAL_COMMUNITY): Payer: BC Managed Care – PPO | Attending: Internal Medicine

## 2011-07-02 DIAGNOSIS — F411 Generalized anxiety disorder: Secondary | ICD-10-CM | POA: Insufficient documentation

## 2011-07-02 DIAGNOSIS — I472 Ventricular tachycardia, unspecified: Secondary | ICD-10-CM | POA: Insufficient documentation

## 2011-07-02 DIAGNOSIS — Q250 Patent ductus arteriosus: Secondary | ICD-10-CM

## 2011-07-02 DIAGNOSIS — D649 Anemia, unspecified: Secondary | ICD-10-CM | POA: Insufficient documentation

## 2011-07-02 DIAGNOSIS — I4729 Other ventricular tachycardia: Secondary | ICD-10-CM | POA: Insufficient documentation

## 2011-07-02 DIAGNOSIS — I471 Supraventricular tachycardia: Secondary | ICD-10-CM

## 2011-07-08 ENCOUNTER — Telehealth: Payer: Self-pay | Admitting: Internal Medicine

## 2011-07-08 NOTE — Telephone Encounter (Signed)
Per pt husband call, pt came in for ECHO last week and pt was informed MD would call back in two days and pt has yet to hear back results from ECHO. Please return pt call to advise/discuss.

## 2011-07-08 NOTE — Telephone Encounter (Signed)
Pt husband calls for  ECHO results.  He is aware the echo has been read but that Dr. Graciela Husbands will need to review and he is at the hospital today and tomorrow. Husband is agreeable with plan and await call back from Dr. Odessa Fleming nurse, Herbert Seta. Mylo Red RN

## 2011-07-09 NOTE — Telephone Encounter (Signed)
I left a message on the patient's husband's identified cell # that Dr. Graciela Husbands is out all week and I will call next week with the final report, but from what I can tell, the report looks good.

## 2011-07-15 NOTE — Telephone Encounter (Signed)
I left a message on the patient's husband's identified cell number of results.

## 2011-10-07 ENCOUNTER — Ambulatory Visit: Payer: BC Managed Care – PPO | Admitting: Internal Medicine

## 2011-10-22 ENCOUNTER — Ambulatory Visit: Payer: BC Managed Care – PPO | Admitting: Internal Medicine

## 2012-01-10 ENCOUNTER — Telehealth: Payer: Self-pay | Admitting: Internal Medicine

## 2012-01-10 DIAGNOSIS — I471 Supraventricular tachycardia: Secondary | ICD-10-CM

## 2012-01-10 NOTE — Telephone Encounter (Signed)
Dr. Tenny Craw DOD aware of pt's symptoms, she will consult with Dr. Ladona Ridgel about this issue, and will let us know about any  Recommendations.

## 2012-01-10 NOTE — Telephone Encounter (Signed)
Pt's husband calling re pt feeling fatigued, and when takes rhythmol and lies down gets SOB for last 2 months, only at night

## 2012-01-10 NOTE — Telephone Encounter (Signed)
Patient states has been taken Rythmol 150 mg twice a day for 2 months. Since she started taken this medication pt is having SOB  30  minutes to an hour after taken this medication. Pt states it is hard for her to breath when lying down in bed a night. Patient states she was taken Labetalol before she was switch to this medication, and it was making her to be SOB also.

## 2012-01-13 NOTE — Telephone Encounter (Signed)
Please review

## 2012-01-14 NOTE — Telephone Encounter (Signed)
Will forward to Dr. Klein for review and recommendations. 

## 2012-01-14 NOTE — Telephone Encounter (Signed)
FU Call: Pt husband calling regarding issues from last week.   1. Can pt take one pill of propafenone per day as oppose to two per day? Pt c/o muscle weakenss, fatigue,  Sob, when pt takes second pill at night and pt lays down.  2. Can pt switch over to some other medication beside propafenone with less side effects?  Please return pt husband call to discuss further.

## 2012-01-14 NOTE — Telephone Encounter (Signed)
As best as I can tell, she has never been on flecanide  It would be reasonable to try her on that at 50 mg bid to start  Thanks steve

## 2012-01-14 NOTE — Telephone Encounter (Signed)
I only received call as DOD to triage.  Have not seen patient. Plan as outlined by Odessa Fleming.

## 2012-01-15 MED ORDER — FLECAINIDE ACETATE 50 MG PO TABS: 50.0000 mg | ORAL_TABLET | Freq: Two times a day (BID) | ORAL | Status: DC

## 2012-01-15 NOTE — Telephone Encounter (Signed)
The patient is aware of Dr. Odessa Fleming recommendations. She will d/c propafenone and start flecainide 50 mg one tablet bid. She will let us know if she cannot tolerate this.

## 2012-01-15 NOTE — Telephone Encounter (Signed)
I left a message for the patient/ spouse to call.

## 2012-02-06 ENCOUNTER — Other Ambulatory Visit: Payer: Self-pay | Admitting: *Deleted

## 2012-02-06 ENCOUNTER — Telehealth: Payer: Self-pay | Admitting: Internal Medicine

## 2012-02-06 DIAGNOSIS — I471 Supraventricular tachycardia: Secondary | ICD-10-CM

## 2012-02-06 MED ORDER — PROPAFENONE HCL 150 MG PO TABS: ORAL_TABLET | ORAL | Status: DC

## 2012-02-06 NOTE — Telephone Encounter (Signed)
Please return call to patient at hm# [phone]  Patient has questions about flecanide medication.

## 2012-02-06 NOTE — Telephone Encounter (Signed)
I spoke with the patient. She states that she never started the flecainide as she has been on this before and developed SOB. She has continued on her propafenone and the SOB has resolved. She will continue on propafenone.

## 2012-04-08 ENCOUNTER — Telehealth: Payer: Self-pay | Admitting: Internal Medicine

## 2012-04-08 DIAGNOSIS — I471 Supraventricular tachycardia: Secondary | ICD-10-CM

## 2012-04-08 MED ORDER — PROPAFENONE HCL 150 MG PO TABS: ORAL_TABLET | ORAL | Status: DC

## 2012-04-08 NOTE — Telephone Encounter (Signed)
rhythmol 150mg  60 day supply [facility] [address]

## 2012-04-22 ENCOUNTER — Other Ambulatory Visit: Payer: Self-pay | Admitting: *Deleted

## 2012-04-22 NOTE — Telephone Encounter (Signed)
Opened in Error.

## 2012-04-23 ENCOUNTER — Other Ambulatory Visit: Payer: Self-pay

## 2012-04-23 DIAGNOSIS — I471 Supraventricular tachycardia: Secondary | ICD-10-CM

## 2012-04-23 MED ORDER — PROPAFENONE HCL 150 MG PO TABS: ORAL_TABLET | ORAL | Status: DC

## 2012-04-23 NOTE — Telephone Encounter (Signed)
Refills for propafenone was sent to [facility] instead of [facility]

## 2012-04-23 NOTE — Telephone Encounter (Signed)
..   Requested Prescriptions   Signed Prescriptions Disp Refills  . propafenone (RYTHMOL) 150 MG tablet 180 tablet 3    Sig: Take one tablet by mouth twice daily.    Authorizing Provider: Duke Salvia    Ordering User: Izaih Kataoka M  refilled to [facility] per Preston Fleeting

## 2013-04-02 ENCOUNTER — Encounter: Payer: Self-pay | Admitting: Internal Medicine

## 2013-04-02 ENCOUNTER — Ambulatory Visit (INDEPENDENT_AMBULATORY_CARE_PROVIDER_SITE_OTHER): Payer: BC Managed Care – PPO | Admitting: Internal Medicine

## 2013-04-02 VITALS — BP 109/72 | HR 85 | Ht 65.0 in | Wt 148.0 lb

## 2013-04-02 DIAGNOSIS — I498 Other specified cardiac arrhythmias: Secondary | ICD-10-CM

## 2013-04-02 DIAGNOSIS — R51 Headache: Secondary | ICD-10-CM

## 2013-04-02 DIAGNOSIS — R519 Headache, unspecified: Secondary | ICD-10-CM | POA: Insufficient documentation

## 2013-04-02 NOTE — Assessment & Plan Note (Signed)
Patient has headaches which she wonders whether they're related to the propafenone. She will stop it.

## 2013-04-02 NOTE — Assessment & Plan Note (Signed)
No recurrent tachycardia palpitations of note. We  discuss catheter ablation. They would like  to pursue that course in the event that she has recurrent tachypalpitations of Rythmol.  We discussed risks and benefits. They will let us know.

## 2013-04-02 NOTE — Patient Instructions (Signed)
We will be in touch 

## 2013-04-02 NOTE — Progress Notes (Signed)
Patient Care Team: Dorisann Frames, MD as PCP - General (Endocrinology)   HPI  Katie Howard is a 37 y.o. female Seen in followup for recurrent SVT. She has been treated on drug therapy; she chose to take propafenone.     She is now complaining of headaches and wonders whether it is related to the medication. She has not had any significant recurrent tachypalpitations Past Medical History  Diagnosis Date  . SVT (supraventricular tachycardia)     OnRythmol; failed to tolerate flecainide  . Anemia     Unspecified  . Hypothyroidism   . Anxiety   . Patent ductus arteriosus 2010    suspected per echocardiogram    No past surgical history on file.  Current Outpatient Prescriptions  Medication Sig Dispense Refill  . propafenone (RYTHMOL) 150 MG tablet Take one tablet by mouth twice daily.  180 tablet  3  . thyroid (ARMOUR) 15 MG tablet Take 15 mg by mouth daily.        Marland Kitchen thyroid (ARMOUR) 60 MG tablet Take 60 mg by mouth daily.        . [DISCONTINUED] flecainide (TAMBOCOR) 50 MG tablet Take 1 tablet (50 mg total) by mouth 2 (two) times daily.  60 tablet  6   No current facility-administered medications for this visit.    Allergies  Allergen Reactions  . Sulfonamide Derivatives     Review of Systems negative except from HPI and PMH  Physical Exam BP 109/72  Pulse 85  Ht 5\' 5"  (1.651 m)  Wt 148 lb (67.132 kg)  BMI 24.63 kg/m2 Well developed and well nourished in no acute distress HENT normal E scleral and icterus clear Neck Supple JVP flat; carotids brisk and full Clear to ausculation  Regular rate and rhythm, no murmurs gallops or rub Soft with active bowel sounds No clubbing cyanosis none Edema Alert and oriented, grossly normal motor and sensory function Skin Warm and Dry  ECG today demonstrates sinus rhythm at 85 Intervals 13/or weight is 37 Poor R wave progression and T wave inversions V1-V4  Assessment and  Plan

## 2013-05-11 ENCOUNTER — Other Ambulatory Visit: Payer: Self-pay | Admitting: Internal Medicine

## 2013-05-26 ENCOUNTER — Telehealth: Payer: Self-pay

## 2013-05-26 NOTE — Telephone Encounter (Signed)
Pt called for refill of propafenone 150mg  qd, Graciela Husbands discontinued it last O/V (04/02/13) because pt said it was causing her headaches. I called pt back & spoke with her husband & he said she never stopped it because she figured out headaches were coming from allergies. I did not refill at this time.

## 2013-05-27 NOTE — Telephone Encounter (Signed)
OK to refill propafenone 150 mg. Would review with the patient to see if she is taking once daily or twice daily. [facility] was for twice daily.

## 2013-05-31 ENCOUNTER — Telehealth: Payer: Self-pay | Admitting: Internal Medicine

## 2013-05-31 DIAGNOSIS — I471 Supraventricular tachycardia: Secondary | ICD-10-CM

## 2013-05-31 MED ORDER — PROPAFENONE HCL 150 MG PO TABS: ORAL_TABLET | ORAL | Status: DC

## 2013-05-31 NOTE — Addendum Note (Signed)
Addended by: Freddi Starr on: 05/31/2013 05:33 PM   Modules accepted: Orders

## 2013-05-31 NOTE — Telephone Encounter (Signed)
Spoke with pt husband, aware script has been sent to [facility]

## 2013-05-31 NOTE — Telephone Encounter (Signed)
New Prob     Pt has some concerns regarding her medication PROAFENONE. Please call.

## 2013-06-01 ENCOUNTER — Other Ambulatory Visit: Payer: Self-pay | Admitting: Internal Medicine

## 2014-01-24 ENCOUNTER — Emergency Department (HOSPITAL_COMMUNITY)
Admission: EM | Admit: 2014-01-24 | Discharge: 2014-01-24 | Disposition: A | Discharge status: Home / Self Care | Payer: BC Managed Care – PPO | Attending: Emergency Medicine | Admitting: Emergency Medicine

## 2014-01-24 ENCOUNTER — Encounter (HOSPITAL_COMMUNITY): Payer: Self-pay | Admitting: Emergency Medicine

## 2014-01-24 DIAGNOSIS — E039 Hypothyroidism, unspecified: Secondary | ICD-10-CM | POA: Insufficient documentation

## 2014-01-24 DIAGNOSIS — Q250 Patent ductus arteriosus: Secondary | ICD-10-CM | POA: Insufficient documentation

## 2014-01-24 DIAGNOSIS — Z862 Personal history of diseases of the blood and blood-forming organs and certain disorders involving the immune mechanism: Secondary | ICD-10-CM | POA: Insufficient documentation

## 2014-01-24 DIAGNOSIS — I471 Supraventricular tachycardia, unspecified: Secondary | ICD-10-CM

## 2014-01-24 DIAGNOSIS — Z79899 Other long term (current) drug therapy: Secondary | ICD-10-CM | POA: Insufficient documentation

## 2014-01-24 MED ORDER — ADENOSINE 6 MG/2ML IV SOLN
6.0000 mg | Freq: Once | INTRAVENOUS | Status: AC
  Administered 2014-01-24: 6 mg via INTRAVENOUS

## 2014-01-24 MED ORDER — ADENOSINE 6 MG/2ML IV SOLN
INTRAVENOUS | Status: AC
  Filled 2014-01-24: qty 6

## 2014-01-24 MED ORDER — SODIUM CHLORIDE 0.9 % IV SOLN
Freq: Once | INTRAVENOUS | Status: AC
  Administered 2014-01-24: 18:00:00 via INTRAVENOUS

## 2014-01-24 NOTE — ED Provider Notes (Signed)
CSN: [id number]     Arrival date & time 01/24/14  1717 History   First MD Initiated Contact with Patient 01/24/14 1732     Chief Complaint  Patient presents with  . Tachycardia     (Consider location/radiation/quality/duration/timing/severity/associated sxs/prior Treatment) HPI Comments: Pt presents with palpitations, mild SOB after developing SVT similar to prior episodes times 6 in the past.  Pt denies significant CP, diaphoresis, N/V.  Pt has had success in treating with vagal maneuvers alone in the past.  Pt only drinks decaf coffee, otherwise no other caffeine, no cold medications or other OTC meds recently.  No fevers, chills, cough.  Pt has been seeing cardiologist in the past.  Pt has a h/o hypothyroidism and is on propafenone which she takes regularly and thyroid tablets.  Non smoker.  PT has tried numerous vagal maneuvers at home as instructed without success.  Pt has been in rhythm for about 5 hours now.  The history is provided by the patient, the spouse and medical records.    Past Medical History  Diagnosis Date  . SVT (supraventricular tachycardia)     OnRythmol; failed to tolerate flecainide  . Anemia     Unspecified  . Hypothyroidism   . Anxiety   . Patent ductus arteriosus 2010    suspected per echocardiogram   History reviewed. No pertinent past surgical history. History reviewed. No pertinent family history. History  Substance Use Topics  . Smoking status: Never Smoker   . Smokeless tobacco: Not on file  . Alcohol Use: No   OB History   Grav Para Term Preterm Abortions TAB SAB Ect Mult Living                 Review of Systems  Constitutional: Negative for fever and chills.  Respiratory: Positive for shortness of breath. Negative for cough.   Cardiovascular: Positive for palpitations. Negative for chest pain.  Psychiatric/Behavioral: The patient is nervous/anxious.   All other systems reviewed and are negative.      Allergies  Sulfonamide  derivatives  Home Medications   Current Outpatient Rx  Name  Route  Sig  Dispense  Refill  . propafenone (RYTHMOL) 150 MG tablet      TAKE 1 TABLET TWICE A DAY   180 tablet   1   . thyroid (ARMOUR) 15 MG tablet   Oral   Take 15 mg by mouth daily.           Marland Kitchen thyroid (ARMOUR) 60 MG tablet   Oral   Take 60 mg by mouth daily.            BP 99/68  Pulse 97  Temp(Src) 98.1 F (36.7 C) (Oral)  Resp 13  SpO2 100% Physical Exam  Nursing note and vitals reviewed. Constitutional: She is oriented to person, place, and time. She appears well-developed and well-nourished. No distress.  HENT:  Head: Normocephalic and atraumatic.  Eyes: EOM are normal.  Neck: Neck supple.  Cardiovascular: Intact distal pulses.  Tachycardia present.   No murmur heard. Pulmonary/Chest: Effort normal. She has no wheezes. She has no rales.  Abdominal: Soft. She exhibits no distension. There is no tenderness.  Musculoskeletal: She exhibits no edema.  Neurological: She is alert and oriented to person, place, and time. She exhibits normal muscle tone. Coordination normal.  Skin: Skin is warm. She is not diaphoretic.  Psychiatric: Her speech is normal and behavior is normal. Her mood appears anxious.    ED Course  CARDIOVERSION  Date/Time: 01/24/2014 6:16 PM Performed by: Lear NgGHIM, Samaira Holzworth Y. Authorized by: Lear NgGHIM, Iyauna Sing Y. Consent: Verbal consent obtained. Risks and benefits: risks, benefits and alternatives were discussed Consent given by: patient and spouse Patient understanding: patient states understanding of the procedure being performed Patient consent: the patient's understanding of the procedure matches consent given Patient identity confirmed: verbally with patient and arm band Time out: Immediately prior to procedure a "time out" was called to verify the correct patient, procedure, equipment, support staff and site/side marked as required. Patient sedated: no Cardioversion basis:  emergent Pre-procedure rhythm: supraventricular tachycardia Patient position: patient was placed in a supine position Number of attempts: 1 Post-procedure rhythm: normal sinus rhythm Patient tolerance: Patient tolerated the procedure well with no immediate complications. Comments: Used adenosine successfully   (including critical care time) Labs Review Labs Reviewed - No data to display Imaging Review No results found.  EKG Interpretation    Date/Time:  Monday January 24 2014 18:15:15 EST Ventricular Rate:  113 PR Interval:  74 QRS Duration: 73 QT Interval:  486 QTC Calculation: 666 R Axis:   136 Text Interpretation:  Sinus tachycardia Low voltage, extremity and precordial leads q waves lateral leads, possibly lead placement error Poor R wave progression Borderline ECG Confirmed by Peak One Surgery CenterGHIM  MD, MICHEAL ([id number]) on 01/24/2014 6:20:36 PM           6:18 PM Adenosine successful, SVT to sinus tachycardia.  No new medications, has not been ill otherwise, no caffeine use or other OTC medication use.  Pt has established relationship with cardiologist, can follow up with same this week.    MDM   Final diagnoses:  Supraventricular tachycardia, paroxysmal    Pt with h/o SVT presents in same condition.  Vagal maneuvers not successful, will try adenosine.      Gavin PoundMichael Y. Oletta LamasGhim, MD 01/26/14 [phone]

## 2014-01-24 NOTE — ED Notes (Signed)
Pt escorted to discharge window. Pt verbalized understanding discharge instructions. In no acute distress.  

## 2014-01-24 NOTE — Discharge Instructions (Signed)
Supraventricular Tachycardia °Supraventricular tachycardia (SVT) is an abnormal heart rhythm (arrhythmia) that causes the heart to beat very fast (tachycardia). This kind of fast heartbeat originates in the upper chambers of the heart (atria). SVT can cause the heart to beat greater than 100 beats per minute. SVT can have a rapid burst of heartbeats. This can start and stop suddenly without warning and is called nonsustained. SVT can also be sustained, in which the heart beats at a continuous fast rate.  °CAUSES  °There can be different causes of SVT. Some of these include: °· Heart valve problems such as mitral valve prolapse. °· An enlarged heart (hypertrophic cardiomyopathy). °· Congenital heart problems. °· Heart inflammation (pericarditis). °· Hyperthyroidism. °· Low potassium or magnesium levels. °· Caffeine. °· Drug use such as cocaine, methamphetamines, or stimulants. °· Some over-the-counter medicines such as: °· Decongestants. °· Diet medicines. °· Herbal medicines. °SYMPTOMS  °Symptoms of SVT can vary. Symptoms depend on whether the SVT is sustained or nonsustained. You may experience: °· No symptoms (asymptomatic). °· An awareness of your heart beating rapidly (palpitations). °· Shortness of breath. °· Chest pain or pressure. °If your blood pressure drops because of the SVT, you may experience: °· Fainting or near fainting. °· Weakness. °· Dizziness. °DIAGNOSIS  °Different tests can be performed to diagnose SVT, such as: °· An electrocardiogram (EKG). This is a painless test that records the electrical activity of your heart. °· Holter monitor. This is a 24 hour recording of your heart rhythm. You will be given a diary. Write down all symptoms that you have and what you were doing at the time you experienced symptoms. °· Arrhythmia monitor. This is a small device that your wear for several weeks. It records the heart rhythm when you have symptoms. °· Echocardiogram. This is an imaging test to help detect  abnormal heart structure such as congenital abnormalities, heart valve problems, or heart enlargement. °· Stress test. This test can help determine if the SVT is related to exercise. °· Electrophysiology study (EPS). This is a procedure that evaluates your heart's electrical system and can help your caregiver find the cause of your SVT. °TREATMENT  °Treatment of SVT depends on the symptoms, how often it recurs, and whether there are any underlying heart problems.  °· If symptoms are rare and no other cardiac disease is present, no treatment may be needed. °· Blood work may be done to check potassium, magnesium, and thyroid hormone levels to see if they are abnormal. If these levels are abnormal, treatment to correct the problems will occur. °Medicines °Your caregiver may use oral medicines to treat SVT. These medicines are given for long-term control of SVT. Medicines may be used alone or in combination with other treatments. These medicines work to slow nerve impulses in the heart muscle. These medicines can also be used to treat high blood pressure. Some of these medicines may include: °· Calcium channel blockers. °· Beta blockers. °· Digoxin. °Nonsurgical procedures °Nonsurgical techniques may be used if oral medicines do not work. Some examples include: °· Cardioversion. This technique uses either drugs or an electrical shock to restore a normal heart rhythm. °· Cardioversion drugs may be given through an intravenous (IV) line to help "reset" the heart rhythm. °· In electrical cardioversion, the caregiver shocks your heart to stop its beat for a split second. This helps to reset the heart to a normal rhythm. °· Ablation. This procedure is done under mild sedation. High frequency radio wave energy is used to   destroy the area of heart tissue responsible for the SVT. °HOME CARE INSTRUCTIONS  °· Do not smoke. °· Only take medicines prescribed by your caregiver. Check with your caregiver before using over-the-counter  medicines. °· Check with your caregiver about how much alcohol and caffeine (coffee, tea, colas, or chocolate) you may have. °· It is very important to keep all follow-up referrals and appointments in order to properly manage this problem. °SEEK IMMEDIATE MEDICAL CARE IF: °· You have dizziness. °· You faint or nearly faint. °· You have shortness of breath. °· You have chest pain or pressure. °· You have sudden nausea or vomiting. °· You have profuse sweating. °· You are concerned about how long your symptoms last. °· You are concerned about the frequency of your SVT episodes. °If you have the above symptoms, call your local emergency services (911 in [place] immediately. Do not drive yourself to the hospital. °MAKE SURE YOU:  °· Understand these instructions. °· Will watch your condition. °· Will get help right away if you are not doing well or get worse. °Document Released: 11/18/2005 Document Revised: 02/10/2012 Document Reviewed: 03/02/2009 °[employer]® Patient Information ©2014 [employer]. ° °

## 2014-01-24 NOTE — ED Notes (Addendum)
Pt tachycardiac hx of SVT, denies caffeine use, family reports pt was on labetalol in past.

## 2014-01-28 ENCOUNTER — Telehealth: Payer: Self-pay | Admitting: Internal Medicine

## 2014-01-28 NOTE — Telephone Encounter (Signed)
New message     Pt was seen in ER Monday for irr heart rate.  She want a follow up with Dr Graciela HusbandsKlein only.  Offered her an appt with the PA/NP but she want Dr Graciela HusbandsKlein only.  Then she said she wanted to talk to the nurse.

## 2014-02-09 ENCOUNTER — Ambulatory Visit (INDEPENDENT_AMBULATORY_CARE_PROVIDER_SITE_OTHER): Payer: BC Managed Care – PPO | Admitting: Internal Medicine

## 2014-02-09 ENCOUNTER — Encounter: Payer: Self-pay | Admitting: *Deleted

## 2014-02-09 ENCOUNTER — Encounter: Payer: Self-pay | Admitting: Internal Medicine

## 2014-02-09 VITALS — BP 118/62 | HR 79 | Ht 65.0 in | Wt 148.0 lb

## 2014-02-09 DIAGNOSIS — I498 Other specified cardiac arrhythmias: Secondary | ICD-10-CM

## 2014-02-09 DIAGNOSIS — I471 Supraventricular tachycardia: Secondary | ICD-10-CM

## 2014-02-09 NOTE — Patient Instructions (Addendum)
Your physician recommends that you continue on your current medications as directed. Please refer to the Current Medication list given to you today.  Your physician has recommended that you have an SVT ablation. Catheter ablation is a medical procedure used to treat some cardiac arrhythmias (irregular heartbeats). During catheter ablation, a long, thin, flexible tube is put into a blood vessel in your groin (upper thigh), or neck. This tube is called an ablation catheter. It is then guided to your heart through the blood vessel. Radio frequency waves destroy small areas of heart tissue where abnormal heartbeats may cause an arrhythmia to start. Please see the instruction sheet given to you today.  See instruction sheet

## 2014-02-09 NOTE — Progress Notes (Signed)
      Patient Care Team: Dorisann FramesBindubal Balan, MD as PCP - General (Endocrinology)   HPI  Katie Howard is a 38 y.o. female Seen in followup for recurrent SVT. She has been treated on drug therapy; she chose to take propafenone.   She was seen just recently in the ER for recurrent SVT requiring adenosine, and she and her hysband come in today requesting catheter ablation for remedy   Past Medical History  Diagnosis Date  . SVT (supraventricular tachycardia)     OnRythmol; failed to tolerate flecainide  . Anemia     Unspecified  . Hypothyroidism   . Anxiety   . Patent ductus arteriosus 2010    suspected per echocardiogram    No past surgical history on file.  Current Outpatient Prescriptions  Medication Sig Dispense Refill  . propafenone (RYTHMOL) 150 MG tablet TAKE 1 TABLET TWICE A DAY  180 tablet  1  . thyroid (ARMOUR) 15 MG tablet Take 15 mg by mouth daily.        Marland Kitchen. thyroid (ARMOUR) 60 MG tablet Take 60 mg by mouth daily.        . [DISCONTINUED] flecainide (TAMBOCOR) 50 MG tablet Take 1 tablet (50 mg total) by mouth 2 (two) times daily.  60 tablet  6   No current facility-administered medications for this visit.    Allergies  Allergen Reactions  . Sulfonamide Derivatives Swelling    Review of Systems negative except from HPI and PMH  Physical Exam BP 118/62  Pulse 79  Ht 5\' 5"  (1.651 m)  Wt 148 lb (67.132 kg)  BMI 24.63 kg/m2 Well developed and nourished in no acute distress HENT normal Neck supple with JVP-flat Carotids brisk and full without bruits Clear Regular rate and rhythm, no murmurs or gallops Abd-soft with active BS without hepatomegaly No Clubbing cyanosis edema Skin-warm and dry A & Oriented  Grossly normal sensory and motor function    Assessment and  Plan  SVT  She and her husband would like to pursue catheter ablation for SVT I have reviewed the procedure and Dr Leonia ReevesGT will review it further with them  Echo 2012 demonstrated normal   Valves and structure

## 2014-02-21 ENCOUNTER — Other Ambulatory Visit (INDEPENDENT_AMBULATORY_CARE_PROVIDER_SITE_OTHER): Payer: BC Managed Care – PPO

## 2014-02-21 DIAGNOSIS — I471 Supraventricular tachycardia: Secondary | ICD-10-CM

## 2014-02-21 DIAGNOSIS — I498 Other specified cardiac arrhythmias: Secondary | ICD-10-CM

## 2014-02-21 LAB — BASIC METABOLIC PANEL
BUN: 10 mg/dL (ref 6–23)
CHLORIDE: 101 meq/L (ref 96–112)
CO2: 29 mEq/L (ref 19–32)
Calcium: 9.5 mg/dL (ref 8.4–10.5)
Creatinine, Ser: 0.7 mg/dL (ref 0.4–1.2)
GFR: 98.04 mL/min (ref >60.00)
Glucose, Bld: 122 mg/dL — ABNORMAL HIGH (ref 70–99)
Potassium: 3.4 mEq/L — ABNORMAL LOW (ref 3.5–5.1)
Sodium: 138 mEq/L (ref 135–145)

## 2014-02-21 LAB — CBC WITH DIFFERENTIAL/PLATELET
BASOS ABS: 0 10*3/uL (ref 0.0–0.1)
Basophils Relative: 0.3 % (ref 0.0–3.0)
EOS ABS: 0.3 10*3/uL (ref 0.0–0.7)
EOS PCT: 2.8 % (ref 0.0–5.0)
HCT: 39 % (ref 36.0–46.0)
Hemoglobin: 13.1 g/dL (ref 12.0–15.0)
Lymphocytes Relative: 17.3 % (ref 12.0–46.0)
Lymphs Abs: 1.7 10*3/uL (ref 0.7–4.0)
MCHC: 33.6 g/dL (ref 30.0–36.0)
MCV: 85.7 fl (ref 78.0–100.0)
MONO ABS: 0.4 10*3/uL (ref 0.1–1.0)
Monocytes Relative: 4 % (ref 3.0–12.0)
NEUTROS PCT: 75.6 % (ref 43.0–77.0)
Neutro Abs: 7.3 10*3/uL (ref 1.4–7.7)
PLATELETS: 268 10*3/uL (ref 150.0–400.0)
RBC: 4.56 Mil/uL (ref 3.87–5.11)
RDW: 13.3 % (ref 11.5–14.6)
WBC: 9.7 10*3/uL (ref 4.5–10.5)

## 2014-02-22 ENCOUNTER — Encounter (HOSPITAL_COMMUNITY): Payer: Self-pay | Admitting: Pharmacy Technician

## 2014-02-22 LAB — HCG, SERUM, QUALITATIVE: PREG SERUM: NEGATIVE

## 2014-02-23 NOTE — Telephone Encounter (Signed)
Spoke with patient who asked me to give instructions to her husband.  Spoke with husband and reviewed pre-procedure instructions with him.  Patient's husband verbalized understanding of all instructions and all of his questions were answered (see letter in epic).

## 2014-02-23 NOTE — Telephone Encounter (Signed)
New problem    Need to know when does the pt need to stop her medication before procedure. Please call

## 2014-03-02 ENCOUNTER — Encounter (HOSPITAL_COMMUNITY)
Admission: RE | Disposition: A | Discharge status: Home / Self Care | Payer: BC Managed Care – PPO | Source: Ambulatory Visit | Attending: Internal Medicine

## 2014-03-02 ENCOUNTER — Ambulatory Visit (HOSPITAL_COMMUNITY)
Admission: RE | Admit: 2014-03-02 | Discharge: 2014-03-03 | Disposition: A | Discharge status: Home / Self Care | Payer: BC Managed Care – PPO | Source: Ambulatory Visit | Attending: Internal Medicine | Admitting: Internal Medicine

## 2014-03-02 ENCOUNTER — Encounter (HOSPITAL_COMMUNITY): Payer: Self-pay | Admitting: General Practice

## 2014-03-02 DIAGNOSIS — R51 Headache: Secondary | ICD-10-CM

## 2014-03-02 DIAGNOSIS — E039 Hypothyroidism, unspecified: Secondary | ICD-10-CM | POA: Insufficient documentation

## 2014-03-02 DIAGNOSIS — I471 Supraventricular tachycardia, unspecified: Secondary | ICD-10-CM | POA: Diagnosis present

## 2014-03-02 DIAGNOSIS — I498 Other specified cardiac arrhythmias: Secondary | ICD-10-CM

## 2014-03-02 DIAGNOSIS — I9589 Other hypotension: Secondary | ICD-10-CM

## 2014-03-02 DIAGNOSIS — Q250 Patent ductus arteriosus: Secondary | ICD-10-CM

## 2014-03-02 DIAGNOSIS — D649 Anemia, unspecified: Secondary | ICD-10-CM | POA: Insufficient documentation

## 2014-03-02 DIAGNOSIS — E0789 Other specified disorders of thyroid: Secondary | ICD-10-CM

## 2014-03-02 DIAGNOSIS — F411 Generalized anxiety disorder: Secondary | ICD-10-CM | POA: Insufficient documentation

## 2014-03-02 HISTORY — PX: SUPRAVENTRICULAR TACHYCARDIA ABLATION: SHX5492

## 2014-03-02 HISTORY — PX: ABLATION: SHX5711

## 2014-03-02 LAB — CREATININE, SERUM
Creatinine, Ser: 0.61 mg/dL (ref 0.50–1.10)
GFR calc Af Amer: >90 mL/min (ref >90)

## 2014-03-02 LAB — CBC
HCT: 35.4 % — ABNORMAL LOW (ref 36.0–46.0)
Hemoglobin: 11.9 g/dL — ABNORMAL LOW (ref 12.0–15.0)
MCH: 28.7 pg (ref 26.0–34.0)
MCHC: 33.6 g/dL (ref 30.0–36.0)
MCV: 85.5 fL (ref 78.0–100.0)
Platelets: 221 K/uL (ref 150–400)
RBC: 4.14 MIL/uL (ref 3.87–5.11)
RDW: 12.8 % (ref 11.5–15.5)
WBC: 8.3 K/uL (ref 4.0–10.5)

## 2014-03-02 LAB — PREGNANCY, URINE: Preg Test, Ur: NEGATIVE

## 2014-03-02 LAB — POCT ACTIVATED CLOTTING TIME: Activated Clotting Time: 182 seconds

## 2014-03-02 LAB — POTASSIUM: POTASSIUM: 3.8 meq/L (ref 3.7–5.3)

## 2014-03-02 SURGERY — SUPRAVENTRICULAR TACHYCARDIA ABLATION
Anesthesia: LOCAL

## 2014-03-02 MED ORDER — PROPAFENONE HCL 150 MG PO TABS
150.0000 mg | ORAL_TABLET | Freq: Two times a day (BID) | ORAL | Status: DC
  Administered 2014-03-02: 150 mg via ORAL
  Filled 2014-03-02 (×5): qty 1

## 2014-03-02 MED ORDER — FENTANYL CITRATE 0.05 MG/ML IJ SOLN
INTRAMUSCULAR | Status: AC
  Filled 2014-03-02: qty 2

## 2014-03-02 MED ORDER — HEPARIN SODIUM (PORCINE) 1000 UNIT/ML IJ SOLN
INTRAMUSCULAR | Status: AC
  Filled 2014-03-02: qty 1

## 2014-03-02 MED ORDER — HEPARIN SODIUM (PORCINE) 5000 UNIT/ML IJ SOLN
5000.0000 [IU] | Freq: Three times a day (TID) | INTRAMUSCULAR | Status: DC
  Administered 2014-03-02 – 2014-03-03 (×2): 5000 [IU] via SUBCUTANEOUS
  Filled 2014-03-02 (×6): qty 1

## 2014-03-02 MED ORDER — SODIUM CHLORIDE 0.9 % IV BOLUS (SEPSIS)
250.0000 mL | Freq: Once | INTRAVENOUS | Status: AC
  Administered 2014-03-02: 250 mL via INTRAVENOUS

## 2014-03-02 MED ORDER — SODIUM CHLORIDE 0.9 % IV SOLN: 250.0000 mL | INTRAVENOUS | Status: DC | PRN

## 2014-03-02 MED ORDER — THYROID 30 MG PO TABS
15.0000 mg | ORAL_TABLET | Freq: Every day | ORAL | Status: DC
  Administered 2014-03-02: 15 mg via ORAL
  Filled 2014-03-02 (×2): qty 1

## 2014-03-02 MED ORDER — FENTANYL CITRATE 0.05 MG/ML IJ SOLN
25.0000 ug | INTRAMUSCULAR | Status: DC | PRN
  Administered 2014-03-02: 25 ug via INTRAVENOUS

## 2014-03-02 MED ORDER — BUPIVACAINE HCL (PF) 0.25 % IJ SOLN
INTRAMUSCULAR | Status: AC
  Filled 2014-03-02: qty 30

## 2014-03-02 MED ORDER — FENTANYL CITRATE 0.05 MG/ML IJ SOLN
INTRAMUSCULAR | Status: AC
  Administered 2014-03-02: 25 ug via INTRAVENOUS
  Filled 2014-03-02: qty 2

## 2014-03-02 MED ORDER — MIDAZOLAM HCL 5 MG/5ML IJ SOLN
INTRAMUSCULAR | Status: AC
  Filled 2014-03-02: qty 5

## 2014-03-02 MED ORDER — SODIUM CHLORIDE 0.9 % IJ SOLN: 3.0000 mL | INTRAMUSCULAR | Status: DC | PRN

## 2014-03-02 MED ORDER — THYROID 60 MG PO TABS
60.0000 mg | ORAL_TABLET | Freq: Every day | ORAL | Status: DC
  Administered 2014-03-02: 60 mg via ORAL
  Filled 2014-03-02 (×2): qty 1

## 2014-03-02 MED ORDER — SODIUM CHLORIDE 0.9 % IJ SOLN: 3.0000 mL | Freq: Two times a day (BID) | INTRAMUSCULAR | Status: DC

## 2014-03-02 MED ORDER — METOPROLOL TARTRATE 1 MG/ML IV SOLN
INTRAVENOUS | Status: AC
  Filled 2014-03-02: qty 5

## 2014-03-02 NOTE — CV Procedure (Signed)
Electrophysiology procedure note  Procedure: Electrophysiologic study and catheter ablation of AV node reentrant tachycardia  Indication: Recurrent SVT despite medical therapy with propafenone  Description of procedure: After informed consent was obtained, the patient was taken to the diagnostic electrophysiologic laboratory in the fasting state. A 6 French quadripolar catheter was inserted percutaneously into the right femoral vein and advanced to the His bundle. A 6 French quadripolar catheter was inserted percutaneously in the right femoral vein and advanced to the right ventricle. A 6 French hexapolar catheter was inserted percutaneously into the right jugular vein and advanced to the coronary sinus. After measurement of the basic intervals, rapid ventricular pacing was carried out from the right ventricle. The patient intervals that was decreased down to 280 ms VA block was observed. During rapid ventricular pacing, the atrial activation was for the most part midline, however there were ecentric activated beats. Next program ventricular stimulation was carried out from the right ventricle at a basic cycle length of 500 ms.. The S1-S2 interval step was decreased from 440 ms down to the retrograde AV node ERP of 230. During programmed ventricular stimulation, the atrial activation was mostly midline. It was decremental. Next programmed atrial stimulation was carried out from the atrium at 500 ms. The S1-S2 interval was otherwise decreased from 440 ms down to 340 ms resulting in the initiation of SVT, cycle length 350 ms.. Mapping was carried out. The atrial activation was midline. The VA interval was very short equal to 0. PVCs placed at the time of His bundle refractoriness did not preexcitation. Ventricular pacing demonstrated V- A-V conduction sequence. A diagnosis of AV node reentrant tachycardia was made. A 7 French quadripolar ablation catheter was inserted percutaneously into the right femoral vein,  and advanced under fluoroscopic guidance into the right atrium. Mapping of the AV node and Koch's triangle was carried out. It was very small. Radiofrequency energy was applied to the site 8 in koch's triangle, resulting in accelerated junctional rhythm. Following ablation, additional rapid atrial pacing was carried out from the atrium and stepwise decreased down to 330 ms resulting in the initiation of SVT. This was again AV node reentrant tachycardia, rate was 300 ms. Additional mapping of Koch's triangle was carried out. Radiofrequency energy was applied to site 5 and Koch's triangle. Accelerated junctional rhythm was demonstrated. Programmed atrial stimulation was again carried out at a cycle length of 400 milliseconds. The S1-S2 interval was stepwise decreased down to 270 ms, resulting in the initiation of SVT. This tachycardia had a cycle length of 280 ms. It was terminated with rapid ventricular pacing. Additional mapping was carried out, and multiple brief radiofrequency energy applications were delivered as close to the AV node at site 3. Despite accelerated junctional rhythm, the patient had infrequent but persistently inducible SVT, which was nonsustained. At this point the right femoral artery was punctured and a 7 [place] quadripolar ablation catheter was inserted into the right femoral artery and advanced under fluoroscopic guidance across the AV node into the left ventricle. The left atrial portion of the slow pathway was mapped. Several very brief radiofrequency energy applications were applied, there is no accelerated junctional rhythm. At this point the decision was made to remove the catheters and return the patient to the holding area for removal of her sheaths. It should be noted that heparin was given, 5000 units, with insertion of a left-sided ablation catheter. Arterial pressure was monitored during ablation from the left side.  Complications: There were no immediate  complications  Results: 1. Baseline intervals - the sinus node cycle length was 700 ms, AH interval was 100 ms, the HV interval was 51 ms, the QRS duration was 85 ms, the QT interval was 360 ms, 2. Baseline ECG - the baseline ECG demonstrates normal sinus rhythm with no ventricular preexcitation 3. Rapid ventricular pacing - rapid ventricular pacing demonstrated VA block at 280 ms. During rapid ventricular pacing, the atrial activation was midline, and decremental. 4. programmed ventricular stimulation - programmed ventricular stimulation was carried out from the right ventricle at basic drive cycle length of 161500 and400 ms. The S1-S2 interval was stepwise decreased and the ventricular refractoriness. During programmed ventricular stimulation, there was no inducible SVT. 5. programmed atrial stimulation - programmed atrial stimulation was carried out from the atrium at 500 ms and 400 milliseconds. The S1 and S2 interval was stepwise decreased down to induction of SVT. There were 3 different SVTs induced ranging from cycle length of 350 to 280 ms. Following catheter ablation, there remained occasionally inducible nonsustained SVT. 6. rapid atrial pacing - rapid atrial pacing was carried out from the atrium at a pacing cycle length of 500 ms. The S1 and S2 interval was a pleasure crease down to 260 ms. Following catheter ablation, the PR interval was less than the RR interval. 7. 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 This resulted in the induction of 3 different SVTs. The cycle length was 350 ms, 300 ms, and 280 ms. PVCs placed at the time of His bundle refractoriness did not preexcitate the atrium.  8. Mapping - mapping of the different SVTs demonstrated slow pathways aggressively closer to the AV node. Koch's triangle was quite small in general. 9. radiofrequency energy application - 29 very brief radiofrequency  energy applications were delivered to the various regions near the AV node. There remained persistently inducible nonsustained SVT at the end of the procedure. Additional RF energy was not applied out of concerns of creating complete heart block in this 38 year old woman.  Conclusion: This demonstrates multiple inducible SVTs utilizing multiple slow pathways. The patient underwent extensive radiofrequency energy applications to the slow pathways yet had persistently inducible but nonsustained SVT at the conclusion of the procedure. Additional radiofrequency energy was not delivered out of concerns for creating complete heart block and the necessity for permanent pacemaker implantation in this very young patient. My expectation is that the patient will be placed back on antiarrhythmic drug therapy for 3 months, followed by discontinuation. Hopefully her arrhythmias will remain quiet.  Lewayne BuntingGregg Sheilah Rayos, M.D.

## 2014-03-02 NOTE — H&P (View-Only) (Signed)
      Patient Care Team: Dorisann FramesBindubal Balan, MD as PCP - General (Endocrinology)   HPI  Katie Howard is a 38 y.o. female Seen in followup for recurrent SVT. She has been treated on drug therapy; she chose to take propafenone.   She was seen just recently in the ER for recurrent SVT requiring adenosine, and she and her hysband come in today requesting catheter ablation for remedy   Past Medical History  Diagnosis Date  . SVT (supraventricular tachycardia)     OnRythmol; failed to tolerate flecainide  . Anemia     Unspecified  . Hypothyroidism   . Anxiety   . Patent ductus arteriosus 2010    suspected per echocardiogram    No past surgical history on file.  Current Outpatient Prescriptions  Medication Sig Dispense Refill  . propafenone (RYTHMOL) 150 MG tablet TAKE 1 TABLET TWICE A DAY  180 tablet  1  . thyroid (ARMOUR) 15 MG tablet Take 15 mg by mouth daily.        Marland Kitchen. thyroid (ARMOUR) 60 MG tablet Take 60 mg by mouth daily.        . [DISCONTINUED] flecainide (TAMBOCOR) 50 MG tablet Take 1 tablet (50 mg total) by mouth 2 (two) times daily.  60 tablet  6   No current facility-administered medications for this visit.    Allergies  Allergen Reactions  . Sulfonamide Derivatives Swelling    Review of Systems negative except from HPI and PMH  Physical Exam BP 118/62  Pulse 79  Ht 5\' 5"  (1.651 m)  Wt 148 lb (67.132 kg)  BMI 24.63 kg/m2 Well developed and nourished in no acute distress HENT normal Neck supple with JVP-flat Carotids brisk and full without bruits Clear Regular rate and rhythm, no murmurs or gallops Abd-soft with active BS without hepatomegaly No Clubbing cyanosis edema Skin-warm and dry A & Oriented  Grossly normal sensory and motor function    Assessment and  Plan  SVT  She and her husband would like to pursue catheter ablation for SVT I have reviewed the procedure and Dr Leonia ReevesGT will review it further with them  Echo 2012 demonstrated normal   Valves and structure

## 2014-03-02 NOTE — Progress Notes (Signed)
UR Completed Kaysi Ourada Graves-Bigelow, RN,BSN [phone]  

## 2014-03-02 NOTE — Progress Notes (Signed)
Echocardiogram 2D Echocardiogram has been performed.  Katie Howard 03/02/2014, 9:28 PM

## 2014-03-02 NOTE — Interval H&P Note (Signed)
History and Physical Interval Note: Patient seen and examined. I met the patient several weeks ago when she came to the office for ongoing evaluation and management of her SVT. I have again reviewed the risks/benefits/goals/expectations of catheter ablation of SVT and she wishes to proceed. 03/02/2014 7:18 AM  Katie Howard  has presented today for surgery, with the diagnosis of svt  The various methods of treatment have been discussed with the patient and family. After consideration of risks, benefits and other options for treatment, the patient has consented to  Procedure(s): SUPRAVENTRICULAR TACHYCARDIA ABLATION (N/A) as a surgical intervention .  The patient's history has been reviewed, patient examined, no change in status, stable for surgery.  I have reviewed the patient's chart and labs.  Questions were answered to the patient's satisfaction.     Mikle Bosworth.D.

## 2014-03-03 ENCOUNTER — Encounter (HOSPITAL_COMMUNITY): Payer: Self-pay | Admitting: *Deleted

## 2014-03-03 DIAGNOSIS — I471 Supraventricular tachycardia: Secondary | ICD-10-CM

## 2014-03-03 NOTE — Discharge Instructions (Signed)
No driving for 3 days. No lifting over 5 lbs for 1 week. No sexual activity for 1 week. Keep procedure site clean & dry. If you notice increased pain, swelling, bleeding or pus, call/return! You may shower, but no soaking baths/hot tubs/pools for 1 week. ° °

## 2014-03-03 NOTE — Discharge Summary (Signed)
ELECTROPHYSIOLOGY DISCHARGE SUMMARY   Patient ID: Katie Howard,  MRN: [id number], DOB/AGE: November 01, 1976 37 y.o.  Admit date: 03/02/2014 Discharge date: 03/03/2014  Primary Care Physician: Dorisann Frames, MD  Primary Cardiologist: Berton Mount, MD  Primary Discharge Diagnosis:  1. 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 Hypothyroidism 2. Anxiety  Procedures This Admission:  1. EPS +RF ablation  Results:  1. Baseline intervals - the sinus node cycle length was 700 ms, AH interval was 100 ms, the HV interval was 51 ms, the QRS duration was 85 ms, the QT interval was 360 ms,  2. Baseline ECG - the baseline ECG demonstrates normal sinus rhythm with no ventricular preexcitation  3. Rapid ventricular pacing - rapid ventricular pacing demonstrated VA block at 280 ms. During rapid ventricular pacing, the atrial activation was midline, and decremental.  4. programmed ventricular stimulation - programmed ventricular stimulation was carried out from the right ventricle at basic drive cycle length of 696 and400 ms. The S1-S2 interval was stepwise decreased and the ventricular refractoriness. During programmed ventricular stimulation, there was no inducible SVT.  5. programmed atrial stimulation - programmed atrial stimulation was carried out from the atrium at 500 ms and 400 milliseconds. The S1 and S2 interval was stepwise decreased down to induction of SVT. There were 3 different SVTs induced ranging from cycle length of 350 to 280 ms. Following catheter ablation, there remained occasionally inducible nonsustained SVT.  6. rapid atrial pacing -  rapid atrial pacing was carried out from the atrium at a pacing cycle length of 500 ms. The S1 and S2 interval was a pleasure crease down to 260 ms. Following catheter ablation, the PR interval was less than the RR interval.  7. 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 This resulted in the induction of 3 different SVTs. The cycle length was 350 ms, 300 ms, and 280 ms. PVCs placed at the time of His bundle refractoriness did not preexcitate the atrium.  8. Mapping - mapping of the different SVTs demonstrated slow pathways aggressively closer to the AV node. Koch's triangle was quite small in general.  9. radiofrequency energy application - 29 very brief radiofrequency energy applications were delivered to the various regions near the AV node. There remained persistently inducible nonsustained SVT at the end of the procedure. Additional RF energy was not applied out of concerns of creating complete heart block in this 38 year old woman.  Conclusion: This demonstrates multiple inducible SVTs utilizing multiple slow pathways. The patient underwent extensive radiofrequency energy applications to the slow pathways yet had persistently inducible but nonsustained SVT at the conclusion of the procedure. Additional radiofrequency energy was not delivered out of concerns for creating complete heart block and the necessity for permanent pacemaker implantation in this very young patient. My expectation is that the patient will be placed back on antiarrhythmic drug therapy for 3 months, followed by discontinuation. Hopefully her arrhythmias will remain quiet.   History and Hospital Course:  Ms. Katie Howard is a 38 year old woman with recurrent SVT despite medical therapy who presented yesterday and underwent EPS +RF ablation. She was found to have multiple inducible  SVTs utilizing slow pathways +RF ablation yet had persistently  inducible but nonsustained SVT at the conclusion of the procedure. Additional radiofrequency energy was not delivered out of concerns for creating complete heart block and the necessity for permanent pacemaker implantation in this very young patient. Please see details as outlined above. She tolerated this procedure well without any immediate complication. She remains hemodynamically stable and afebrile. Her groin site is intact without significant bleeding or hematoma. She has been given discharge instructions including wound care and activity restrictions. There were no changes made to her medications. She will follow-up in clinic in 6 weeks. She has been seen, examined and deemed stable for discharge today by Dr. Lewayne BuntingGregg Taylor.  Physical Exam: Vitals: Blood pressure 98/60, pulse 88, temperature 98.9 F (37.2 C), temperature source Oral, resp. rate 18, height 5\' 5"  (1.651 m), weight 142 lb (64.411 kg), last menstrual period 02/05/2014, SpO2 100.00%.  General: Well developed, well appearing 38 year old female in no acute distress.  Neck: Supple. JVD not elevated.  Lungs: Clear bilaterally to auscultation without wheezes, rales, or rhonchi. Breathing is unlabored.  Heart: Regular S1 S2 without murmurs, rubs, or gallops.  Abdomen: Soft, non-distended.  Extremities: No clubbing or cyanosis. No edema. Distal pedal pulses are 2+ and equal bilaterally. Right groin intact without hematoma.  Neuro: Alert and oriented X 3. Moves all extremities spontaneously. No focal deficits.  Labs: Lab Results  Component Value Date   WBC 8.3 03/02/2014   HGB 11.9* 03/02/2014   HCT 35.4* 03/02/2014   MCV 85.5 03/02/2014   PLT 221 03/02/2014     Recent Labs Lab 03/02/14 0700 03/02/14 1628  K 3.8  --   CREATININE  --  0.61      Component Value Date/Time   NA 138 02/21/2014 1545   K 3.8 03/02/2014 0700   CL 101 02/21/2014 1545   CO2 29 02/21/2014 1545   GLUCOSE 122* 02/21/2014 1545   BUN 10 02/21/2014 1545   CREATININE  0.61 03/02/2014 1628   CALCIUM 9.5 02/21/2014 1545   GFRNONAA >90 03/02/2014 1628   GFRAA >90 03/02/2014 1628    Disposition:  The patient is being discharged in stable condition.  Follow-up:     Follow-up Information   Follow up with Lewayne BuntingGregg Taylor, MD On 04/13/2014. (At 9:15 AM)    Specialty:  Cardiology   Contact information:   [address] [place] [address] [phone]      Discharge Medications:    Medication List         propafenone 150 MG tablet  Commonly known as:  RYTHMOL  Take 150 mg by mouth 2 (two) times daily.     thyroid 15 MG tablet  Commonly known as:  ARMOUR  Take 15 mg by mouth daily.     thyroid 60 MG tablet  Commonly known as:  ARMOUR  Take 60 mg by mouth daily.       Duration of Discharge Encounter: Greater than 30 minutes including physician time.  Signed, Rick DuffDMISTEN, Ariann Khaimov, PA-C 03/03/2014, 8:43 AM

## 2014-04-13 ENCOUNTER — Ambulatory Visit (INDEPENDENT_AMBULATORY_CARE_PROVIDER_SITE_OTHER): Payer: BC Managed Care – PPO | Admitting: Internal Medicine

## 2014-04-13 ENCOUNTER — Encounter: Payer: Self-pay | Admitting: Internal Medicine

## 2014-04-13 VITALS — BP 103/75 | HR 88 | Ht 66.0 in | Wt 140.0 lb

## 2014-04-13 DIAGNOSIS — I471 Supraventricular tachycardia, unspecified: Secondary | ICD-10-CM

## 2014-04-13 DIAGNOSIS — I498 Other specified cardiac arrhythmias: Secondary | ICD-10-CM

## 2014-04-13 NOTE — Progress Notes (Signed)
      HPI  [name] returns today for followup after catheter ablation of AVNRT. She had a very difficult ablation and still had some residual intermediate pathway conduction. We ablated very close to the AV node. Since then she has remained on her rhythmol and has had no SVT. Allergies  Allergen Reactions  . Sulfonamide Derivatives Swelling     Current Outpatient Prescriptions  Medication Sig Dispense Refill  . propafenone (RYTHMOL) 150 MG tablet Take 150 mg by mouth 2 (two) times daily.      Marland Kitchen. thyroid (ARMOUR) 15 MG tablet Take 15 mg by mouth daily.        Marland Kitchen. thyroid (ARMOUR) 60 MG tablet Take 60 mg by mouth daily.        . [DISCONTINUED] flecainide (TAMBOCOR) 50 MG tablet Take 1 tablet (50 mg total) by mouth 2 (two) times daily.  60 tablet  6   No current facility-administered medications for this visit.     Past Medical History  Diagnosis Date  . SVT (supraventricular tachycardia)     OnRythmol; failed to tolerate flecainide  . Anemia     Unspecified  . Hypothyroidism   . Anxiety   . Patent ductus arteriosus 2010    suspected per echocardiogram    ROS:   All systems reviewed and negative except as noted in the HPI.   Past Surgical History  Procedure Laterality Date  . Ablation  03/02/2014    AVNRT ablation of multiple slow pathways by Dr Ladona Ridgelaylor     No family history on file.   History   Social History  . Marital Status: Married    Spouse Name: N/A    Number of Children: N/A  . Years of Education: N/A   Occupational History  . Not on file.   Social History Main Topics  . Smoking status: Never Smoker   . Smokeless tobacco: Never Used  . Alcohol Use: No  . Drug Use: No  . Sexual Activity: Not on file   Other Topics Concern  . Not on file   Social History Narrative  . No narrative on file     BP 103/75  Pulse 88  Ht 5\' 6"  (1.676 m)  Wt 140 lb (63.504 kg)  BMI 22.61 kg/m2  Physical Exam:  Well appearing NAD HEENT:  Unremarkable Neck:  No JVD, no thyromegally Lymphatics:  No adenopathy Back:  No CVA tenderness Lungs:  Clear with no wheezes. HEART:  Regular rate rhythm, no murmurs, no rubs, no clicks Abd:  soft, positive bowel sounds, no organomegally, no rebound, no guarding Ext:  2 plus pulses, no edema, no cyanosis, no clubbing Skin:  No rashes no nodules Neuro:  CN II through XII intact, motor grossly intact  EKG - NSR   Assess/Plan:

## 2014-04-13 NOTE — Assessment & Plan Note (Signed)
She has done well after catheter ablation. We will stop rhythmol today. I will see her back as needed. If she required additional ablation, the likelihood of CHB would be extremely high and I would favor restarting rhythmol instead of another ablation.

## 2014-04-13 NOTE — Patient Instructions (Signed)
Your physician recommends that you schedule a follow-up appointment as needed  Your physician has recommended you make the following change in your medication:  1) Decrease Rhythmol to 1/2 tablet twice daily for one week then 1/2 tablet daily for one week then stop

## 2014-11-10 ENCOUNTER — Encounter (HOSPITAL_COMMUNITY): Payer: Self-pay | Admitting: Internal Medicine

## 2015-04-09 ENCOUNTER — Telehealth: Payer: Self-pay | Admitting: Interventional Cardiology

## 2015-04-09 DIAGNOSIS — R0602 Shortness of breath: Secondary | ICD-10-CM
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
## 2015-04-10 NOTE — Telephone Encounter (Signed)
Called to inform pt of order for chest xray. Pt's husband states that they actually went to urgent care yesterday and had chest xray done. Husband states that it was done at [facility] on [address] Husband states that they were told xrays came back "fairly normal". Informed husband that I would call the Urgent Care and have them send over the xrays/results. Husband verbalized understanding.  Called over to the Urgent Care and had them send over information. Provided fax number and they said they were sending the information over.

## 2015-04-10 NOTE — Telephone Encounter (Signed)
Order placed for Chest Xray

## 2016-07-26 ENCOUNTER — Encounter: Payer: Self-pay | Admitting: Cardiology

## 2016-07-28 NOTE — Progress Notes (Signed)
Cardiology Office Note   Date:  07/29/2016   ID:  Katie Howard, DOB 06-08-1976, MRN [id number]  PCP:  Dorisann Frames, MD  Cardiologist: Dr. Ladona Ridgel    Chief Complaint  Patient presents with  . Chest Pain      History of Present Illness: Katie Howard is a 40 y.o. female who presents for chest pain.  She has 2 types of chest pain.  One is a poking pain and the other more of a pressure.  She does have SOB with the pressure.  No nausea.  These occur 1-2 X a week.  Sometimes after meals.  Sometimes after spicy food.  In addition she does have rare episodes. Of tachycardia but does not last long- seconds.  She would like medication to take if this occurs.    Last seen by Dr. Ladona Ridgel 04/2014.  catheter ablation of AVNRT. She had a very difficult ablation and still had some residual intermediate pathway conduction. They ablated very close to the AV node.  [name] was stopped 2015 and may have a few seconds of SVT at times.      Past Medical History:  Diagnosis Date  . Anemia    Unspecified  . Anxiety   . Hypothyroidism   . Patent ductus arteriosus 2010   suspected per echocardiogram  . SVT (supraventricular tachycardia) (HCC)    OnRythmol; failed to tolerate flecainide    Past Surgical History:  Procedure Laterality Date  . ABLATION  03/02/2014   AVNRT ablation of multiple slow pathways by Dr Ladona Ridgel  . SUPRAVENTRICULAR TACHYCARDIA ABLATION N/A 03/02/2014   Procedure: SUPRAVENTRICULAR TACHYCARDIA ABLATION;  Surgeon: Marinus Maw, MD;  Location: [facility] CATH LAB;  Service: Cardiovascular;  Laterality: N/A;     Current Outpatient Prescriptions  Medication Sig Dispense Refill  . ergocalciferol (VITAMIN D2) 50000 units capsule Take 50,000 Units by mouth once a week.    . thyroid (ARMOUR) 15 MG tablet Take 15 mg by mouth daily.      Marland Kitchen thyroid (ARMOUR) 60 MG tablet Take 60 mg by mouth daily.      . famotidine (PEPCID) 20 MG tablet Take 1 tablet (20 mg total) by mouth 2  (two) times daily. 60 tablet 11  . metoprolol tartrate (LOPRESSOR) 25 MG tablet Take 0.5 tablets (12.5 mg total) by mouth daily as needed (For fast HR). 15 tablet 3   No current facility-administered medications for this visit.     Allergies:   Sulfonamide derivatives    Social History:  The patient  reports that she has never smoked. She has never used smokeless tobacco. She reports that she does not drink alcohol or use drugs.   Family History:  The patient's family history includes Healthy in her sister and sister; Hypertension in her father and mother.    ROS:  General:no colds or fevers, + weight decrease of 10 lbs since 2015.  Skin:no rashes or ulcers HEENT:no blurred vision, no congestion CV:see HPI PUL:see HPI GI:no diarrhea constipation or melena, no indigestion GU:no hematuria, no dysuria MS:no joint pain, no claudication Neuro:no syncope, no lightheadedness Endo:no diabetes, + thyroid disease and is stable  Wt Readings from Last 3 Encounters:  07/29/16 130 lb 6.4 oz (59.1 kg)  04/13/14 140 lb (63.5 kg)  03/02/14 142 lb (64.4 kg)     PHYSICAL EXAM: VS:  BP 104/62   Pulse 81   Ht 5\' 4"  (1.626 m)   Wt 130 lb 6.4 oz (59.1 kg)   BMI 22.38 kg/m  ,  BMI Body mass index is 22.38 kg/m. General:Pleasant affect, NAD Skin:Warm and dry, brisk capillary refill HEENT:normocephalic, sclera clear, mucus membranes moist Neck:supple, no JVD, no bruits  Heart:S1S2 RRR without murmur, gallup, rub or click Lungs:clear without rales, rhonchi, or wheezes ZOX:WRUEAbd:soft, non tender, + BS, do not palpate liver spleen or masses Ext:no lower ext edema, 2+ pedal pulses, 2+ radial pulses Neuro:alert and oriented X 3, MAE, follows commands, + facial symmetry    EKG:  EKG is ordered today. The ekg ordered today demonstrates SR no acute changes from 2015.     Recent Labs: No results found for requested labs within last 8760 hours.    Lipid Panel No results found for: CHOL, TRIG, HDL,  CHOLHDL, VLDL, LDLCALC, LDLDIRECT     Other studies Reviewed: Additional studies/ records that were reviewed today include:  ECHO 2015. Study Conclusions  - Left ventricle: The cavity size was normal. Wall thickness was normal. Systolic function was normal. The estimated ejection fraction was in the range of 50% to 55%. Wall motion was normal; there were no regional wall motion abnormalities. - Aortic valve: Trivial regurgitation.   ASSESSMENT AND PLAN:  1.  Chest pain, two types these may be GI will add pepcid 20 mg BID.  Will also repeat echo and do ETT.  She will call if symptoms increase.  2. SUPRAVENTRICULAR TACHYCARDIA -  Per Dr. Ladona Ridgelaylor"   She has done well after catheter ablation. We will stop rhythmol today. I will see her back as needed. If she required additional ablation, the likelihood of CHB would be extremely high and I would favor restarting rhythmol instead of another ablation."     She has rare episode of fast HR - I have added lopressor 25 mg to take half a tablet daily prn rapid HR.    She will follow up with me in 2 weeks or so for results.    Current medicines are reviewed with the patient today.  The patient Has no concerns regarding medicines.  The following changes have been made:  See above Labs/ tests ordered today include:see above  Disposition:   FU:  see above  Signed, Nada BoozerLaura Ingold, NP  07/29/2016 12:21 PM    [facility] [address], [place], [place]  [address]/ [address] [place] Phone: [phone]; Fax: [phone]  [phone]

## 2016-07-29 ENCOUNTER — Encounter: Payer: Self-pay | Admitting: Cardiology

## 2016-07-29 ENCOUNTER — Ambulatory Visit (INDEPENDENT_AMBULATORY_CARE_PROVIDER_SITE_OTHER): Payer: BLUE CROSS/BLUE SHIELD | Admitting: Cardiology

## 2016-07-29 VITALS — BP 104/62 | HR 81 | Ht 64.0 in | Wt 130.4 lb

## 2016-07-29 DIAGNOSIS — R079 Chest pain, unspecified: Secondary | ICD-10-CM | POA: Diagnosis not present

## 2016-07-29 DIAGNOSIS — I471 Supraventricular tachycardia: Secondary | ICD-10-CM

## 2016-07-29 DIAGNOSIS — R0602 Shortness of breath: Secondary | ICD-10-CM

## 2016-07-29 MED ORDER — FAMOTIDINE 20 MG PO TABS: 20.0000 mg | ORAL_TABLET | Freq: Two times a day (BID) | ORAL | 11 refills | Status: DC

## 2016-07-29 MED ORDER — METOPROLOL TARTRATE 25 MG PO TABS: 12.5000 mg | ORAL_TABLET | Freq: Every day | ORAL | 3 refills | Status: DC | PRN

## 2016-07-29 NOTE — Patient Instructions (Signed)
Medication Instructions:  1) START PEPCID 20 mg TWICE DAILY 2) START METOPROLOL 12.5 mg DAILY AS NEEDED for fast heart rate  Labwork: None  Testing/Procedures: Your physician has requested that you have an echocardiogram. Echocardiography is a painless test that uses sound waves to create images of your heart. It provides your doctor with information about the size and shape of your heart and how well your heart's chambers and valves are working. This procedure takes approximately one hour. There are no restrictions for this procedure.  Your physician has requested that you have an exercise tolerance test. For further information please visit [email or web address]. Please also follow instruction sheet, as given.  Follow-Up: Your physician recommends that you schedule a follow-up appointment in 2 weeks after your testing with Nada BoozerLaura Ingold, NP.  Any Other Special Instructions Will Be Listed Below (If Applicable).     If you need a refill on your cardiac medications before your next appointment, please call your pharmacy.

## 2016-07-29 NOTE — Addendum Note (Signed)
Addended by: Gunnar FusiKEMP, Louanne Calvillo A on: 07/29/2016 03:39 PM   Modules accepted: Orders

## 2016-08-08 ENCOUNTER — Other Ambulatory Visit: Payer: Self-pay

## 2016-08-08 ENCOUNTER — Ambulatory Visit (INDEPENDENT_AMBULATORY_CARE_PROVIDER_SITE_OTHER): Payer: BLUE CROSS/BLUE SHIELD

## 2016-08-08 ENCOUNTER — Ambulatory Visit (HOSPITAL_COMMUNITY): Payer: BLUE CROSS/BLUE SHIELD | Attending: Cardiology

## 2016-08-08 DIAGNOSIS — I351 Nonrheumatic aortic (valve) insufficiency: Secondary | ICD-10-CM | POA: Diagnosis not present

## 2016-08-08 DIAGNOSIS — I471 Supraventricular tachycardia: Secondary | ICD-10-CM | POA: Insufficient documentation

## 2016-08-08 DIAGNOSIS — R0600 Dyspnea, unspecified: Secondary | ICD-10-CM | POA: Insufficient documentation

## 2016-08-08 DIAGNOSIS — R079 Chest pain, unspecified: Secondary | ICD-10-CM | POA: Diagnosis present

## 2016-08-08 LAB — EXERCISE TOLERANCE TEST
CHL CUP STRESS STAGE 1 GRADE: 0 %
CHL CUP STRESS STAGE 1 SPEED: 0 mph
CHL CUP STRESS STAGE 2 DBP: 73 mmHg
CHL CUP STRESS STAGE 2 GRADE: 0 %
CHL CUP STRESS STAGE 4 GRADE: 0.1 %
CHL CUP STRESS STAGE 5 DBP: 71 mmHg
CHL CUP STRESS STAGE 5 SBP: 122 mmHg
CHL CUP STRESS STAGE 5 SPEED: 1.7 mph
CHL CUP STRESS STAGE 6 SBP: 122 mmHg
CHL CUP STRESS STAGE 8 GRADE: 0 %
CHL CUP STRESS STAGE 8 SPEED: 1.5 mph
CHL CUP STRESS STAGE 9 DBP: 75 mmHg
CHL CUP STRESS STAGE 9 HR: 108 {beats}/min
CHL CUP STRESS STAGE 9 SBP: 108 mmHg
CHL CUP STRESS STAGE 9 SPEED: 0 mph
Estimated workload: 8.5 METS
Exercise duration (min): 7 min
Exercise duration (sec): 0 s
MPHR: 180 {beats}/min
Peak HR: 171 {beats}/min
Percent HR: 95 %
Percent of predicted max HR: 95 %
RPE: 18
Rest HR: 89 {beats}/min
Stage 1 HR: 96 {beats}/min
Stage 2 HR: 91 {beats}/min
Stage 2 SBP: 109 mmHg
Stage 2 Speed: 0 mph
Stage 3 Grade: 0 %
Stage 3 HR: 101 {beats}/min
Stage 3 Speed: 1 mph
Stage 4 HR: 101 {beats}/min
Stage 4 Speed: 1 mph
Stage 5 Grade: 10 %
Stage 5 HR: 131 {beats}/min
Stage 6 DBP: 71 mmHg
Stage 6 Grade: 12 %
Stage 6 HR: 157 {beats}/min
Stage 6 Speed: 2.5 mph
Stage 7 Grade: 14 %
Stage 7 HR: 171 {beats}/min
Stage 7 Speed: 3.4 mph
Stage 8 DBP: 72 mmHg
Stage 8 HR: 153 {beats}/min
Stage 8 SBP: 120 mmHg
Stage 9 Grade: 0 %

## 2016-08-15 ENCOUNTER — Ambulatory Visit: Payer: BLUE CROSS/BLUE SHIELD | Admitting: Cardiology

## 2016-08-31 ENCOUNTER — Emergency Department (HOSPITAL_COMMUNITY)
Admission: EM | Admit: 2016-08-31 | Discharge: 2016-09-01 | Disposition: A | Discharge status: Home / Self Care | Payer: BLUE CROSS/BLUE SHIELD | Attending: Emergency Medicine | Admitting: Emergency Medicine

## 2016-08-31 DIAGNOSIS — I471 Supraventricular tachycardia: Secondary | ICD-10-CM | POA: Diagnosis not present

## 2016-08-31 DIAGNOSIS — R000 Tachycardia, unspecified: Secondary | ICD-10-CM | POA: Diagnosis present

## 2016-08-31 DIAGNOSIS — E039 Hypothyroidism, unspecified: Secondary | ICD-10-CM | POA: Diagnosis not present

## 2016-08-31 NOTE — ED Triage Notes (Signed)
Per EMS, pt reports that she began to have "racing heart rate" around 10:30pm and would not stop despite attempts to "try the tricks."  EMS found her to have a heart rate of 220-240.  She was given 6 of adenisin which converted her to normal SR (100bpm).  She was given 1Lt of NS as her blood pressure was soft.

## 2016-08-31 NOTE — ED Provider Notes (Signed)
[facility]-EMERGENCY DEPT Provider Note   CSN: [id number] Arrival date & time: 08/31/16  2340   By signing my name below, I, Suzan Slick. Elon Spanner, attest that this documentation has been prepared under the direction and in the presence of Rolland Porter, MD.  Electronically Signed: Suzan Slick. Elon Spanner, ED Scribe. 08/31/16. 12:04 AM.   History   Chief Complaint Chief Complaint  Patient presents with  . Tachycardia   The history is provided by the patient. No language interpreter was used.    HPI Comments: Katie Howard, brought in by EMS is a 40 y.o. female with a PMHx of SVT who presents to the Emergency Department complaining of an episode of "my heart racing" onset this evening at 10:30 PM after resting to go to bed for the evening. Several attempts to go back into rhythm including using ice cold water and bearing down, without any success. 25 mg of Metoprolol also attempted without any improvement. Pt then called 911. En route to department, HR was 220-240. She was given 6 mg of Adenosine along with 1 liter of NS which converted her back to normal SR at 100 BMP. Pt admits to a history of same. States episodes are intermittent but typically only last 1-5 minutes.  PCP: Dorisann Frames, MD   CARDIOLOGIST: Lewayne Bunting, MD  Past Medical History:  Diagnosis Date  . Anemia    Unspecified  . Anxiety   . Hypothyroidism   . Patent ductus arteriosus 2010   suspected per echocardiogram  . SVT (supraventricular tachycardia) (HCC)    OnRythmol; failed to tolerate flecainide    Patient Active Problem List   Diagnosis Date Noted  . Paroxysmal supraventricular tachycardia (HCC) 03/02/2014  . Headache(784.0) 04/02/2013  . OTHER SPECIFIED DISORDERS OF THYROID 12/19/2009  . SUPRAVENTRICULAR TACHYCARDIA 12/18/2009  . Patent ductus arteriosus 12/18/2009    Past Surgical History:  Procedure Laterality Date  . ABLATION  03/02/2014   AVNRT ablation of multiple slow pathways by Dr Ladona Ridgel  .  SUPRAVENTRICULAR TACHYCARDIA ABLATION N/A 03/02/2014   Procedure: SUPRAVENTRICULAR TACHYCARDIA ABLATION;  Surgeon: Marinus Maw, MD;  Location: [facility] CATH LAB;  Service: Cardiovascular;  Laterality: N/A;    OB History    No data available       Home Medications    Prior to Admission medications   Medication Sig Start Date End Date Taking? Authorizing Provider  metoprolol tartrate (LOPRESSOR) 25 MG tablet Take 0.5 tablets (12.5 mg total) by mouth daily as needed (For fast HR). Patient taking differently: Take 12.5-25 mg by mouth daily as needed (For fast HR).  07/29/16 10/27/16 Yes Leone Brand, NP  thyroid (ARMOUR) 15 MG tablet Take 15 mg by mouth daily.     Yes Historical Provider, MD  thyroid (ARMOUR) 60 MG tablet Take 60 mg by mouth daily.     Yes Historical Provider, MD  famotidine (PEPCID) 20 MG tablet Take 1 tablet (20 mg total) by mouth 2 (two) times daily. Patient not taking: Reported on 09/01/2016 07/29/16   Leone Brand, NP    Family History Family History  Problem Relation Age of Onset  . Hypertension Mother   . Hypertension Father   . Healthy Sister   . Healthy Sister     Social History Social History  Substance Use Topics  . Smoking status: Never Smoker  . Smokeless tobacco: Never Used  . Alcohol use No     Allergies   Sulfonamide derivatives   Review of Systems Review of Systems  Constitutional:  Negative for chills and fever.  Respiratory: Negative for cough.   Cardiovascular: Negative for chest pain.  Gastrointestinal: Negative for nausea and vomiting.  All other systems reviewed and are negative.    Physical Exam Updated Vital Signs BP 104/77   Pulse 92   Temp 97.9 F (36.6 C) (Oral)   Resp 19   Ht 5\' 5"  (1.651 m)   Wt 130 lb (59 kg)   SpO2 100%   BMI 21.63 kg/m   Physical Exam  Constitutional: She is oriented to person, place, and time. She appears well-developed and well-nourished. No distress.  HENT:  Head: Normocephalic.  Eyes:  Conjunctivae are normal. Pupils are equal, round, and reactive to light. No scleral icterus.  Neck: Normal range of motion. Neck supple. No thyromegaly present.  Cardiovascular: Normal rate and regular rhythm.  Exam reveals no gallop and no friction rub.   No murmur heard. Pulmonary/Chest: Effort normal and breath sounds normal. No respiratory distress. She has no wheezes. She has no rales.  Abdominal: Soft. Bowel sounds are normal. She exhibits no distension. There is no tenderness. There is no rebound.  Musculoskeletal: Normal range of motion.  Neurological: She is alert and oriented to person, place, and time.  Skin: Skin is warm and dry. No rash noted.  Psychiatric: She has a normal mood and affect. Her behavior is normal.     ED Treatments / Results   DIAGNOSTIC STUDIES: Oxygen Saturation is 100% on RA, Normal by my interpretation.    COORDINATION OF CARE: 12:03 AM-Discussed treatment plan with pt at bedside and pt agreed to plan.     Labs (all labs ordered are listed, but only abnormal results are displayed) Labs Reviewed  CBC WITH DIFFERENTIAL/PLATELET - Abnormal; Notable for the following:       Result Value   RBC 3.78 (*)    Hemoglobin 11.1 (*)    HCT 33.6 (*)    All other components within normal limits  BASIC METABOLIC PANEL - Abnormal; Notable for the following:    Glucose, Bld 149 (*)    Calcium 7.8 (*)    Anion gap 4 (*)    All other components within normal limits  MAGNESIUM  I-STAT TROPOININ, ED    EKG  EKG Interpretation None       Radiology No results found.  Procedures Procedures (including critical care time)  Medications Ordered in ED Medications - No data to display   Initial Impression / Assessment and Plan / ED Course  I have reviewed the triage vital signs and the nursing notes.  Pertinent labs & imaging results that were available during my care of the patient were reviewed by me and considered in my medical decision making (see  chart for details).  Clinical Course    Pt remains sx free.  Labs without concerns.  DC to FU with Dr. Ladona Ridgelaylor  Final Clinical Impressions(s) / ED Diagnoses   Final diagnoses:  SVT (supraventricular tachycardia) (HCC)    New Prescriptions New Prescriptions   No medications on file   I personally performed the services described in this documentation, which was scribed in my presence. The recorded information has been reviewed and is accurate.    Rolland PorterMark Arora Coakley, MD 09/01/16 [phone]

## 2016-09-01 LAB — CBC WITH DIFFERENTIAL/PLATELET
BASOS PCT: 0 %
Basophils Absolute: 0 10*3/uL (ref 0.0–0.1)
EOS ABS: 0.3 10*3/uL (ref 0.0–0.7)
Eosinophils Relative: 4 %
HEMATOCRIT: 33.6 % — AB (ref 36.0–46.0)
Hemoglobin: 11.1 g/dL — ABNORMAL LOW (ref 12.0–15.0)
Lymphocytes Relative: 22 %
Lymphs Abs: 1.7 10*3/uL (ref 0.7–4.0)
MCH: 29.4 pg (ref 26.0–34.0)
MCHC: 33 g/dL (ref 30.0–36.0)
MCV: 88.9 fL (ref 78.0–100.0)
MONO ABS: 0.5 10*3/uL (ref 0.1–1.0)
MONOS PCT: 6 %
Neutro Abs: 5.2 10*3/uL (ref 1.7–7.7)
Neutrophils Relative %: 68 %
Platelets: 240 10*3/uL (ref 150–400)
RBC: 3.78 MIL/uL — ABNORMAL LOW (ref 3.87–5.11)
RDW: 12.3 % (ref 11.5–15.5)
WBC: 7.6 10*3/uL (ref 4.0–10.5)

## 2016-09-01 LAB — BASIC METABOLIC PANEL
Anion gap: 4 — ABNORMAL LOW (ref 5–15)
BUN: 13 mg/dL (ref 6–20)
CALCIUM: 7.8 mg/dL — AB (ref 8.9–10.3)
CO2: 22 mmol/L (ref 22–32)
CREATININE: 0.69 mg/dL (ref 0.44–1.00)
Chloride: 109 mmol/L (ref 101–111)
GFR calc Af Amer: >60 mL/min (ref >60)
GFR calc non Af Amer: >60 mL/min (ref >60)
Glucose, Bld: 149 mg/dL — ABNORMAL HIGH (ref 65–99)
Potassium: 4.1 mmol/L (ref 3.5–5.1)
SODIUM: 135 mmol/L (ref 135–145)

## 2016-09-01 LAB — I-STAT TROPONIN, ED: Troponin i, poc: 0.01 ng/mL (ref 0.00–0.08)

## 2016-09-01 LAB — MAGNESIUM: Magnesium: 1.7 mg/dL (ref 1.7–2.4)

## 2016-09-01 NOTE — ED Notes (Signed)
MD at bedside. 

## 2016-09-19 ENCOUNTER — Ambulatory Visit (INDEPENDENT_AMBULATORY_CARE_PROVIDER_SITE_OTHER): Payer: BLUE CROSS/BLUE SHIELD | Admitting: Internal Medicine

## 2016-09-19 ENCOUNTER — Encounter: Payer: Self-pay | Admitting: Internal Medicine

## 2016-09-19 VITALS — BP 104/80 | HR 84 | Ht 66.0 in | Wt 129.2 lb

## 2016-09-19 DIAGNOSIS — I471 Supraventricular tachycardia: Secondary | ICD-10-CM

## 2016-09-19 MED ORDER — METOPROLOL SUCCINATE ER 50 MG PO TB24: 50.0000 mg | ORAL_TABLET | Freq: Every day | ORAL | 3 refills | Status: DC | PRN

## 2016-09-19 NOTE — Patient Instructions (Addendum)
Your physician has recommended you make the following change in your medication: METOPROLOL  50 MG  AS  NEEDED  FOR   HEART  RACING  Your physician wants you to follow-up in: 6 MONTHS  WITH DR Court JoyAYLOR   You will receive a reminder letter in the mail two months in advance. If you don't receive a letter, please call our office to schedule the follow-up appointment. AVOID  CAFFEINE

## 2016-09-19 NOTE — Progress Notes (Signed)
HPI  Mrs. 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 She had a very difficult ablation and still had some residual intermediate pathway conduction. We ablated very close to the AV node. Since then she had done well until a few weeks ago when she had recurrent palpitations and was found to be in SVT at 240/min. She was treated with IV adenosine. She has had none since. She admits to dietary indiscretion with caffeine.  Allergies  Allergen Reactions  . Sulfonamide Derivatives Swelling     Current Outpatient Prescriptions  Medication Sig Dispense Refill  . famotidine (PEPCID) 20 MG tablet Take 1 tablet (20 mg total) by mouth 2 (two) times daily. 60 tablet 11  . Multiple Vitamin (MULTIVITAMIN) capsule Take 1 capsule by mouth daily.    Marland Kitchen SYNTHROID 88 MCG tablet Take 88 mcg by mouth daily.  6  . Vitamin D, Ergocalciferol, (DRISDOL) 50000 units CAPS capsule Take 50,000 Units by mouth once a week.    . metoprolol succinate (TOPROL-XL) 50 MG 24 hr tablet Take 1 tablet (50 mg total) by mouth daily as needed. Take with or immediately following a meal. 30 tablet 3   No current facility-administered medications for this visit.      Past Medical History:  Diagnosis Date  . Anemia    Unspecified  . Anxiety   . Hypothyroidism   . Patent ductus arteriosus 2010   suspected per echocardiogram  . SVT (supraventricular tachycardia) (HCC)    OnRythmol; failed to tolerate flecainide    ROS:   All systems reviewed and negative except as noted in the HPI.   Past Surgical History:  Procedure Laterality Date  . ABLATION  03/02/2014   AVNRT ablation of multiple slow pathways by Dr Ladona Ridgel  . SUPRAVENTRICULAR TACHYCARDIA ABLATION N/A 03/02/2014   Procedure: SUPRAVENTRICULAR TACHYCARDIA ABLATION;  Surgeon: Marinus Maw, MD;  Location: [facility] CATH LAB;  Service: Cardiovascular;  Laterality: N/A;     Family History  Problem Relation Age of Onset  .  Hypertension Mother   . Hypertension Father   . Healthy Sister   . Healthy Sister      Social History   Social History  . Marital status: Married    Spouse name: N/A  . Number of children: N/A  . Years of education: N/A   Occupational History  . Not on file.   Social History Main Topics  . Smoking status: Never Smoker  . Smokeless tobacco: Never Used  . Alcohol use No  . Drug use: No  . Sexual activity: Not on file   Other Topics Concern  . Not on file   Social History Narrative  . No narrative on file     BP 104/80   Pulse 84   Ht 5\' 6"  (1.676 m)   Wt 129 lb 3.2 oz (58.6 kg)   BMI 20.85 kg/m   Physical Exam:  Well appearing NAD HEENT: Unremarkable Neck:  No JVD, no thyromegally Lymphatics:  No adenopathy Back:  No CVA tenderness Lungs:  Clear with no wheezes. HEART:  Regular rate rhythm, no murmurs, no rubs, no clicks Abd:  soft, positive bowel sounds, no organomegally, no rebound, no guarding Ext:  2 plus pulses, no edema, no cyanosis, no clubbing Skin:  No rashes no nodules Neuro:  CN II through XII intact, motor grossly intact  EKG - NSR   Assess/Plan: 1. Recurrent SVT - I discussed the treatment options. Her  residual pathway was very close to the AV node. The risk of PPM with another ablation is very high. I have offered her re-initiation of Rhythmol. For now she wishes to avoid caffeine and take as needed beta blockers. I will see her back in 6 months.   Leonia ReevesGregg Taylor,M.D.

## 2017-07-07 ENCOUNTER — Telehealth: Payer: Self-pay | Admitting: Internal Medicine

## 2017-07-07 ENCOUNTER — Emergency Department (HOSPITAL_COMMUNITY)
Admission: EM | Admit: 2017-07-07 | Discharge: 2017-07-07 | Disposition: A | Discharge status: Home / Self Care | Payer: BLUE CROSS/BLUE SHIELD | Attending: Emergency Medicine | Admitting: Emergency Medicine

## 2017-07-07 ENCOUNTER — Emergency Department (HOSPITAL_COMMUNITY): Payer: BLUE CROSS/BLUE SHIELD

## 2017-07-07 DIAGNOSIS — R002 Palpitations: Secondary | ICD-10-CM | POA: Diagnosis present

## 2017-07-07 DIAGNOSIS — I471 Supraventricular tachycardia: Secondary | ICD-10-CM | POA: Insufficient documentation

## 2017-07-07 DIAGNOSIS — Z79899 Other long term (current) drug therapy: Secondary | ICD-10-CM | POA: Diagnosis not present

## 2017-07-07 LAB — I-STAT TROPONIN, ED: TROPONIN I, POC: 0.01 ng/mL (ref 0.00–0.08)

## 2017-07-07 LAB — I-STAT CHEM 8, ED
BUN: 10 mg/dL (ref 6–20)
CHLORIDE: 105 mmol/L (ref 101–111)
CREATININE: 0.7 mg/dL (ref 0.44–1.00)
Calcium, Ion: 1.08 mmol/L — ABNORMAL LOW (ref 1.15–1.40)
GLUCOSE: 178 mg/dL — AB (ref 65–99)
HCT: 37 % (ref 36.0–46.0)
Hemoglobin: 12.6 g/dL (ref 12.0–15.0)
Potassium: 3.6 mmol/L (ref 3.5–5.1)
Sodium: 138 mmol/L (ref 135–145)
TCO2: 20 mmol/L (ref 0–100)

## 2017-07-07 MED ORDER — ADENOSINE 6 MG/2ML IV SOLN
INTRAVENOUS | Status: AC
  Filled 2017-07-07: qty 6

## 2017-07-07 MED ORDER — PANTOPRAZOLE SODIUM 20 MG PO TBEC: 20.0000 mg | DELAYED_RELEASE_TABLET | Freq: Every day | ORAL | 0 refills | Status: DC

## 2017-07-07 MED ORDER — ADENOSINE 6 MG/2ML IV SOLN
6.0000 mg | Freq: Once | INTRAVENOUS | Status: AC
  Administered 2017-07-07: 6 mg via INTRAVENOUS

## 2017-07-07 NOTE — Telephone Encounter (Signed)
Pt's husband (DPR, pt not there) states pt in ED earlier today with SVT, given adenosine, told to follow up with Dr Ladona Ridgelaylor.   I offered pt appt tomorrow, 07/08/17-husband declined, I offered appt 07/11/17, husband declined, husband requested appt after 07/24/17. I have scheduled pt to see Janean Sarkene Ursuy, PA 07/25/17 at 8 AM.

## 2017-07-07 NOTE — ED Notes (Signed)
ED Provider at bedside.EDP ZAMMIT PRESENT EVALUATING PT.FAMIILY PRESENT

## 2017-07-07 NOTE — Discharge Instructions (Signed)
Follow up with Dr. Ladona Ridgelaylor in 2 weeks return if problems

## 2017-07-07 NOTE — Telephone Encounter (Signed)
New message      Pt was seen in the ER recently.  Talk to a nurse

## 2017-07-07 NOTE — ED Provider Notes (Signed)
[facility]-EMERGENCY DEPT Provider Note   CSN: [id number] Arrival date & time: 07/07/17  0711     History   Chief Complaint No chief complaint on file.   HPI Katie Howard is a 41 y.o. female.  Patient complains of rapid heart rate. She has had one ablation before   The history is provided by the patient.  Palpitations   This is a recurrent problem. The current episode started less than 1 hour ago. The problem occurs rarely. The problem has not changed since onset.The problem is associated with an unknown factor. Pertinent negatives include no diaphoresis, no chest pain, no abdominal pain, no headaches, no back pain and no cough. She has tried nothing for the symptoms. The treatment provided no relief. There are no known risk factors. Her past medical history does not include hyperthyroidism.    Past Medical History:  Diagnosis Date  . Anemia    Unspecified  . Anxiety   . Hypothyroidism   . Patent ductus arteriosus 2010   suspected per echocardiogram  . SVT (supraventricular tachycardia) (HCC)    OnRythmol; failed to tolerate flecainide    Patient Active Problem List   Diagnosis Date Noted  . Paroxysmal supraventricular tachycardia (HCC) 03/02/2014  . Headache(784.0) 04/02/2013  . OTHER SPECIFIED DISORDERS OF THYROID 12/19/2009  . SUPRAVENTRICULAR TACHYCARDIA 12/18/2009  . Patent ductus arteriosus 12/18/2009    Past Surgical History:  Procedure Laterality Date  . ABLATION  03/02/2014   AVNRT ablation of multiple slow pathways by Dr Ladona Ridgel  . SUPRAVENTRICULAR TACHYCARDIA ABLATION N/A 03/02/2014   Procedure: SUPRAVENTRICULAR TACHYCARDIA ABLATION;  Surgeon: Marinus Maw, MD;  Location: [facility] CATH LAB;  Service: Cardiovascular;  Laterality: N/A;    OB History    No data available       Home Medications    Prior to Admission medications   Medication Sig Start Date End Date Taking? Authorizing Provider  metoprolol succinate (TOPROL-XL) 50 MG 24 hr tablet Take 1  tablet (50 mg total) by mouth daily as needed. Take with or immediately following a meal. Patient taking differently: Take 50 mg by mouth daily as needed (for heart rate). Take with or immediately following a meal. 09/19/16 07/07/17 Yes Marinus Maw, MD  Multiple Vitamin (MULTIVITAMIN WITH MINERALS) TABS tablet Take 1 tablet by mouth daily.   Yes [provider]  SYNTHROID 88 MCG tablet Take 88 mcg by mouth daily. 09/03/16  Yes [provider]  pantoprazole (PROTONIX) 20 MG tablet Take 1 tablet (20 mg total) by mouth daily. 07/07/17   Bethann Berkshire, MD    Family History Family History  Problem Relation Age of Onset  . Hypertension Mother   . Hypertension Father   . Healthy Sister   . Healthy Sister     Social History Social History  Substance Use Topics  . Smoking status: Never Smoker  . Smokeless tobacco: Never Used  . Alcohol use No     Allergies   Sulfonamide derivatives   Review of Systems Review of Systems  Constitutional: Negative for appetite change, diaphoresis and fatigue.  HENT: Negative for congestion, ear discharge and sinus pressure.   Eyes: Negative for discharge.  Respiratory: Negative for cough.   Cardiovascular: Positive for palpitations. Negative for chest pain.  Gastrointestinal: Negative for abdominal pain and diarrhea.  Genitourinary: Negative for frequency and hematuria.  Musculoskeletal: Negative for back pain.  Skin: Negative for rash.  Neurological: Negative for seizures and headaches.  Psychiatric/Behavioral: Negative for hallucinations.  Physical Exam Updated Vital Signs BP 95/69 (BP Location: Left Arm)   Pulse 80   Temp 98.2 F (36.8 C) (Oral)   Resp 17   LMP 06/11/2017 (Approximate)   SpO2 100%   Physical Exam  Constitutional: She is oriented to person, place, and time. She appears well-developed.  HENT:  Head: Normocephalic.  Eyes: Conjunctivae and EOM are normal. No scleral icterus.  Neck: Neck supple. No  thyromegaly present.  Cardiovascular: Regular rhythm.  Exam reveals no gallop and no friction rub.   No murmur heard. Tachycardia with rate over 200  Pulmonary/Chest: No stridor. She has no wheezes. She has no rales. She exhibits no tenderness.  Abdominal: She exhibits no distension. There is no tenderness. There is no rebound.  Musculoskeletal: Normal range of motion. She exhibits no edema.  Lymphadenopathy:    She has no cervical adenopathy.  Neurological: She is oriented to person, place, and time. She exhibits normal muscle tone. Coordination normal.  Skin: No rash noted. No erythema.  Psychiatric: She has a normal mood and affect. Her behavior is normal.     ED Treatments / Results  Labs (all labs ordered are listed, but only abnormal results are displayed) Labs Reviewed  I-STAT CHEM 8, ED - Abnormal; Notable for the following:       Result Value   Glucose, Bld 178 (*)    Calcium, Ion 1.08 (*)    All other components within normal limits  I-STAT TROPONIN, ED    EKG  EKG Interpretation None       Radiology Dg Chest Port 1 View  Result Date: 07/07/2017 CLINICAL DATA:  Tachycardia EXAM: PORTABLE CHEST 1 VIEW COMPARISON:  None. FINDINGS: Normal heart size and mediastinal contours. No acute infiltrate or edema. No effusion or pneumothorax. No acute osseous findings. IMPRESSION: Negative portable chest. Electronically Signed   By: Marnee SpringJonathon  Watts M.D.   On: 07/07/2017 08:18    Procedures Procedures (including critical care time)  Medications Ordered in ED Medications  adenosine (ADENOCARD) 6 MG/2ML injection 6 mg (6 mg Intravenous Given 07/07/17 0737)     Initial Impression / Assessment and Plan / ED Course  I have reviewed the triage vital signs and the nursing notes.  Pertinent labs & imaging results that were available during my care of the patient were reviewed by me and considered in my medical decision making (see chart for details). CRITICAL CARE Performed by:  Tomasa Dobransky L Total critical care time:45 minutes Critical care time was exclusive of separately billable procedures and treating other patients. Critical care was necessary to treat or prevent imminent or life-threatening deterioration. Critical care was time spent personally by me on the following activities: development of treatment plan with patient and/or surrogate as well as nursing, discussions with consultants, evaluation of patient's response to treatment, examination of patient, obtaining history from patient or surrogate, ordering and performing treatments and interventions, ordering and review of laboratory studies, ordering and review of radiographic studies, pulse oximetry and re-evaluation of patient's condition.     Patient was given adenosine and converted to normal sinus rhythm. She will be referred back to her cardiologist  Final Clinical Impressions(s) / ED Diagnoses   Final diagnoses:  SVT (supraventricular tachycardia) (HCC)    New Prescriptions New Prescriptions   PANTOPRAZOLE (PROTONIX) 20 MG TABLET    Take 1 tablet (20 mg total) by mouth daily.     Bethann BerkshireZammit, Elva Breaker, MD 07/07/17 [phone]

## 2017-07-24 NOTE — Progress Notes (Deleted)
Cardiology Office Note Date:  07/24/2017  Patient ID:  Nohea, Kras 09-06-1976, MRN [id number] PCP:  Dorisann Frames, MD  Electrophysiologist: Dr. Ladona Ridgel  ***refresh   Chief Complaint: recurrent SVT  History of Present Illness: Charelle Petrakis is a 41 y.o. female with history of AVNRT with ablation that Dr. Ladona Ridgel mentioned very difficult ablation and still had some residual intermediate pathway conduction. We ablated very close to the AV node.  She was last seen by him in October 2017, at that time she had a recurrent episode of SVT 240bpm, he discussed treatment options. "Her residual pathway was very close to the AV node. The risk of PPM with another ablation is very high. I have offered her re-initiation of Rhythmol. For now she wishes to avoid caffeine and take as needed beta blockers"   PMHx otherwise noted for hypothyroidism, anxiety.  07/07/17 she was at the ER w/palpitations, found in SVT given adenosine  *** annual palpitations, ??  Re-visit ablation? AAD, BB (propafenone failed histrorically) *** labs in ER ok, no TSH *** meds *** triggers? *** EKG today   Past Medical History:  Diagnosis Date  . Anemia    Unspecified  . Anxiety   . Hypothyroidism   . Patent ductus arteriosus 2010   suspected per echocardiogram  . SVT (supraventricular tachycardia) (HCC)    OnRythmol; failed to tolerate flecainide    Past Surgical History:  Procedure Laterality Date  . ABLATION  03/02/2014   AVNRT ablation of multiple slow pathways by Dr Ladona Ridgel  . SUPRAVENTRICULAR TACHYCARDIA ABLATION N/A 03/02/2014   Procedure: SUPRAVENTRICULAR TACHYCARDIA ABLATION;  Surgeon: Marinus Maw, MD;  Location: [facility] CATH LAB;  Service: Cardiovascular;  Laterality: N/A;    Current Outpatient Prescriptions  Medication Sig Dispense Refill  . metoprolol succinate (TOPROL-XL) 50 MG 24 hr tablet Take 1 tablet (50 mg total) by mouth daily as needed. Take with or immediately following a meal.  (Patient taking differently: Take 50 mg by mouth daily as needed (for heart rate). Take with or immediately following a meal.) 30 tablet 3  . Multiple Vitamin (MULTIVITAMIN WITH MINERALS) TABS tablet Take 1 tablet by mouth daily.    . pantoprazole (PROTONIX) 20 MG tablet Take 1 tablet (20 mg total) by mouth daily. 30 tablet 0  . SYNTHROID 88 MCG tablet Take 88 mcg by mouth daily.  6   No current facility-administered medications for this visit.     Allergies:   Sulfonamide derivatives   Social History:  The patient  reports that she has never smoked. She has never used smokeless tobacco. She reports that she does not drink alcohol or use drugs.   Family History:  The patient's family history includes Healthy in her sister and sister; Hypertension in her father and mother.  ROS:  Please see the history of present illness.    All other systems are reviewed and otherwise negative.   PHYSICAL EXAM: *** VS:  There were no vitals taken for this visit. BMI: There is no height or weight on file to calculate BMI. Well nourished, well developed, in no acute distress  HEENT: normocephalic, atraumatic  Neck: no JVD, carotid bruits or masses Cardiac:  ***  RRR; no significant murmurs, no rubs, or gallops Lungs:  *** CTA b/l, no wheezing, rhonchi or rales  Abd: soft, nontender MS: no deformity or *** atrophy Ext: *** no edema  Skin: warm and dry, no rash Neuro:  No gross deficits appreciated Psych: euthymic mood, full affect  EKG:  Done today and reviewed by myself *** *** ER  08/08/16: TTE Study Conclusions - Left ventricle: The cavity size was normal. Wall thickness was   normal. Systolic function was normal. The estimated ejection   fraction was in the range of 55% to 60%. Wall motion was normal;   there were no regional wall motion abnormalities. Left   ventricular diastolic function parameters were normal. - Aortic valve: There was no stenosis. There was mild   regurgitation. -  Mitral valve: There was no significant regurgitation. - Right ventricle: The cavity size was normal. Systolic function   was normal. - Pulmonary arteries: No complete TR doppler jet so unable to   estimate PA systolic pressure. - Inferior vena cava: The vessel was normal in size. The   respirophasic diameter changes were in the normal range (= 50%),   consistent with normal central venous pressure. Impressions: - Normal study except for mild aortic insufficiency.   08/08/16: EST  Blood pressure demonstrated a normal response to exercise.  There was no ST segment deviation noted during stress.  No T wave inversion was noted during stress.  Negative, adequate stress test.   03/02/14: EPS/ablation, Dr. 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 The patient underwent extensive radiofrequency energy applications to the slow pathways yet had persistently inducible but nonsustained SVT at the conclusion of the procedure. Additional radiofrequency energy was not delivered out of concerns for creating complete heart block and the necessity for permanent pacemaker implantation in this very young patient. My expectation is that the patient will be placed back on antiarrhythmic drug therapy for 3 months, followed by discontinuation. Hopefully her arrhythmias will remain quiet.   Recent Labs: 09/01/2016: Magnesium 1.7; Platelets 240 07/07/2017: BUN 10; Creatinine, Ser 0.70; Hemoglobin 12.6; Potassium 3.6; Sodium 138  No results found for requested labs within last 8760 hours.   CrCl cannot be calculated (Unknown ideal weight.).   Wt Readings from Last 3 Encounters:  09/19/16 129 lb 3.2 oz (58.6 kg)  08/31/16 130 lb (59 kg)  07/29/16 130 lb 6.4 oz (59.1 kg)     Other studies reviewed: Additional studies/records reviewed today include: summarized above  ASSESSMENT AND PLAN:  1. Recurrent SVT     ***   Disposition: F/u with ***  Current  medicines are reviewed at length with the patient today.  The patient did not have any concerns regarding medicines.***  Signed, Sherrilee Gilles, PA-C 07/24/2017 5:52 AM     [facility] [address] [place] [address] [phone] (office)  [phone] (fax)

## 2017-07-25 ENCOUNTER — Ambulatory Visit: Payer: BLUE CROSS/BLUE SHIELD | Admitting: Physician Assistant

## 2017-07-28 ENCOUNTER — Encounter: Payer: Self-pay | Admitting: Physician Assistant

## 2017-09-12 IMAGING — DX DG CHEST 1V PORT
1 series · 1 of 1 positions shown · non-contrast
Comparison: None.

CLINICAL DATA: Tachycardia

EXAM:
PORTABLE CHEST 1 VIEW

[chest ap]
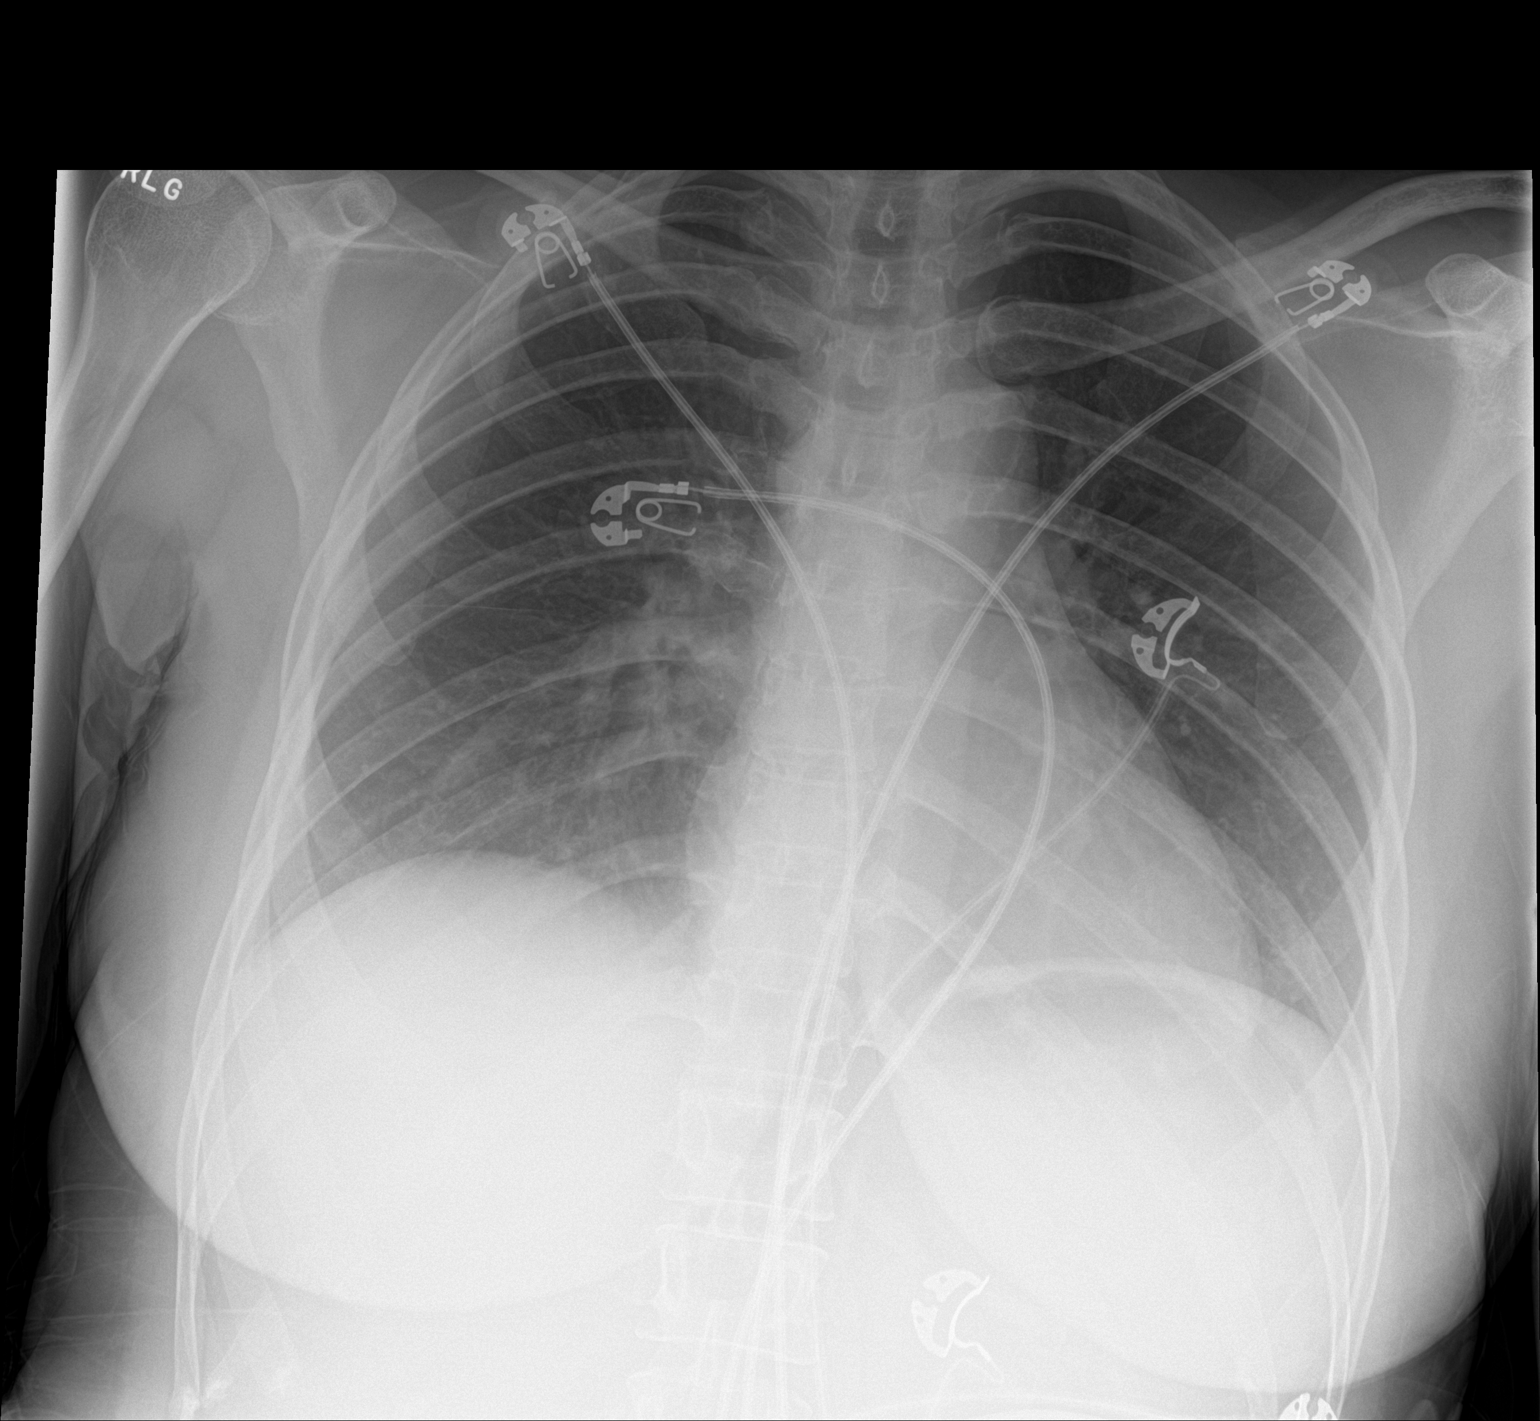

[1 of 1 positions shown; findings below may reference images not displayed]

FINDINGS: Normal heart size and mediastinal contours. No acute infiltrate or
edema. No effusion or pneumothorax. No acute osseous findings.
IMPRESSION: Negative portable chest.

## 2017-10-01 ENCOUNTER — Ambulatory Visit (INDEPENDENT_AMBULATORY_CARE_PROVIDER_SITE_OTHER): Payer: BLUE CROSS/BLUE SHIELD | Admitting: Internal Medicine

## 2017-10-01 ENCOUNTER — Encounter: Payer: Self-pay | Admitting: Internal Medicine

## 2017-10-01 VITALS — BP 90/58 | HR 92 | Ht 64.0 in | Wt 136.0 lb

## 2017-10-01 DIAGNOSIS — I471 Supraventricular tachycardia: Secondary | ICD-10-CM | POA: Diagnosis not present

## 2017-10-01 MED ORDER — FLECAINIDE ACETATE 50 MG PO TABS: ORAL_TABLET | ORAL | 3 refills | Status: DC

## 2017-10-01 NOTE — Progress Notes (Addendum)
HPI The patient is a 41 yo woman with a h/o SVT who underwent catheter ablation approximately 3 years ago. At the time of her procedure, her slow pathway was mapped to the region very close to the normal fast pathway. Ablation therapy resulted in reduction of her inducibility, but she continued to have nonsustained SVT after the procedure. She did well for 2 years but then had recurrent SVT approximately one year ago. She had a second recurrence earlier this fall/late summer. She had documented SVT at 240 bpm which was treated with intravenous adenosine. She notes that prior to the episode she had been sleeping poorly for several days. She denies any overuse of caffeine or any alcohol consumption. She denies chest pain or shortness of breath but felt both during her episode of SVT. No syncope. Allergies  Allergen Reactions  . Sulfonamide Derivatives Swelling     Current Outpatient Prescriptions  Medication Sig Dispense Refill  . Multiple Vitamin (MULTIVITAMIN WITH MINERALS) TABS tablet Take 1 tablet by mouth daily.    Marland Kitchen. SYNTHROID 88 MCG tablet Take 88 mcg by mouth daily.  6  . flecainide (TAMBOCOR) 50 MG tablet Take up to 4 tablets by mouth daily as needed for heart racing/palpitations. 180 tablet 3  . pantoprazole (PROTONIX) 20 MG tablet Take 1 tablet (20 mg total) by mouth daily. (Patient not taking: Reported on 10/01/2017) 30 tablet 0   No current facility-administered medications for this visit.      Past Medical History:  Diagnosis Date  . Anemia    Unspecified  . Anxiety   . Hypothyroidism   . Patent ductus arteriosus 2010   suspected per echocardiogram  . SVT (supraventricular tachycardia) (HCC)    OnRythmol; failed to tolerate flecainide    ROS:   All systems reviewed and negative except as noted in the HPI.   Past Surgical History:  Procedure Laterality Date  . ABLATION  03/02/2014   AVNRT ablation of multiple slow pathways by Dr Ladona Ridgelaylor  . SUPRAVENTRICULAR  TACHYCARDIA ABLATION N/A 03/02/2014   Procedure: SUPRAVENTRICULAR TACHYCARDIA ABLATION;  Surgeon: Marinus MawGregg W Rumor Sun, MD;  [facility];  Service: Cardiovascular;  Laterality: N/A;     Family History  Problem Relation Age of Onset  . Hypertension Mother   . Hypertension Father   . Healthy Sister   . Healthy Sister      Social History   Social History  . Marital status: Married    Spouse name: N/A  . Number of children: N/A  . Years of education: N/A   Occupational History  . Not on file.   Social History Main Topics  . Smoking status: Never Smoker  . Smokeless tobacco: Never Used  . Alcohol use No  . Drug use: No  . Sexual activity: Not on file   Other Topics Concern  . Not on file   Social History Narrative  . No narrative on file     BP (!) 90/58   Pulse 92   Ht 5\' 4"  (1.626 m)   Wt 136 lb (61.7 kg)   BMI 23.34 kg/m   Physical Exam:  Well appearing 41 yo woman, NAD HEENT: Unremarkable Neck:  6 cm JVD, no thyromegally Lymphatics:  No adenopathy Back:  No CVA tenderness Lungs:  Clear with no wheezes HEART:  Regular rate rhythm, no murmurs, no rubs, no clicks Abd:  soft, positive bowel sounds, no organomegally, no rebound, no guarding Ext:  2 plus pulses, no  edema, no cyanosis, no clubbing Skin:  No rashes no nodules Neuro:  CN II through XII intact, motor grossly intact  EKG - NSR with no pre-excitation  Assess/Plan: 1. SVT - we discussed the treatment options in detail. Catheter ablation would impose a much higher risk of complete heart block based on what we know about her slow pathway. She is not inclined to proceed with a repeat ablation. Medical therapy with beta blocker has resulted in for severe fatigue. We discussed alternative medications and I have given her a prescription for flecainide 50 mg. The patient does not want to take daily medical therapy but she is interested in using the flecainide as a pill in the pocket. If she has  palpitations or symptoms of recurrent SVT, I recommended that she take 100 mg and if her symptoms persist take a second 100 mg dose 30 minutes to an hour after the first. She is instructed to call me if she has recurrent symptoms. We talked about the importance of getting adequate daily rest and avoiding caffeine.  I spent over 25 minutes with the patient including over 50% face-to-face time  Lewayne Bunting, M.D.

## 2017-10-01 NOTE — Patient Instructions (Signed)
Medication Instructions:  Your physician has recommended you make the following change in your medication:  1.  Start taking flecainide.  Take flecainide 50 mg by mouth daily as needed.  May take up to 200 mg in a 24 hour period.    If heart is racing for more than an hour go to ER.  Labwork: None ordered.  Testing/Procedures: None ordered.  Follow-Up: Your physician wants you to follow-up in: 6 months with Dr. Ladona Ridgelaylor.  You will receive a reminder letter in the mail two months in advance. If you don't receive a letter, please call our office to schedule the follow-up appointment.   Any Other Special Instructions Will Be Listed Below (If Applicable).     If you need a refill on your cardiac medications before your next appointment, please call your pharmacy.

## 2018-10-22 ENCOUNTER — Other Ambulatory Visit: Payer: Self-pay | Admitting: Internal Medicine

## 2018-10-22 NOTE — Telephone Encounter (Signed)
° ° ° °*  STAT* If patient is at the pharmacy, call can be transferred to refill team.   1. Which medications need to be refilled? (please list name of each medication and dose if known) flecainide (TAMBOCOR) 50 MG tablet  2. Which pharmacy/location (including street and city if local pharmacy) is medication to be sent to? [id number] - [place], [place] - [address]  3. Do they need a 30 day or 90 day supply? 30

## 2018-11-04 ENCOUNTER — Ambulatory Visit: Payer: BLUE CROSS/BLUE SHIELD | Admitting: Internal Medicine

## 2018-11-13 ENCOUNTER — Other Ambulatory Visit: Payer: Self-pay | Admitting: Internal Medicine

## 2018-12-31 ENCOUNTER — Encounter: Payer: Self-pay | Admitting: Internal Medicine

## 2018-12-31 ENCOUNTER — Ambulatory Visit: Payer: BLUE CROSS/BLUE SHIELD | Admitting: Internal Medicine

## 2018-12-31 VITALS — BP 102/64 | HR 73 | Ht 64.0 in | Wt 144.6 lb

## 2018-12-31 DIAGNOSIS — I471 Supraventricular tachycardia: Secondary | ICD-10-CM

## 2018-12-31 NOTE — Patient Instructions (Addendum)

## 2018-12-31 NOTE — Progress Notes (Signed)
HPI Katie Howard returns for ongoing evaluation and management of her SVT. 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 We modified her slow pathway and she had no SVT for 2 years but since then has had a couple of episodes, the last 5  Days ago. She took 200 mg of flecainide and her racing stopped after a couple of hours. She has otherwise been stable. Allergies  Allergen Reactions  . Sulfonamide Derivatives Swelling     Current Outpatient Medications  Medication Sig Dispense Refill  . flecainide (TAMBOCOR) 50 MG tablet TAKE UP TO 4 TABLETS BY MOUTH DAILY AS NEEDED FOR HEART RACING/PALPITATIONS. 120 tablet 1  . levothyroxine (SYNTHROID, LEVOTHROID) 88 MCG tablet Take 88 mcg by mouth daily before breakfast.     No current facility-administered medications for this visit.      Past Medical History:  Diagnosis Date  . Anemia    Unspecified  . Anxiety   . Hypothyroidism   . Patent ductus arteriosus 2010   suspected per echocardiogram  . SVT (supraventricular tachycardia) (HCC)    OnRythmol; failed to tolerate flecainide    ROS:   All systems reviewed and negative except as noted in the HPI.   Past Surgical History:  Procedure Laterality Date  . ABLATION  03/02/2014   AVNRT ablation of multiple slow pathways by Dr Ladona Ridgel  . SUPRAVENTRICULAR TACHYCARDIA ABLATION N/A 03/02/2014   Procedure: SUPRAVENTRICULAR TACHYCARDIA ABLATION;  Surgeon: Marinus Maw, MD;  Location: [facility] CATH LAB;  Service: Cardiovascular;  Laterality: N/A;     Family History  Problem Relation Age of Onset  . Hypertension Mother   . Hypertension Father   . Healthy Sister   . Healthy Sister      Social History   Socioeconomic History  . Marital status: Married    Spouse name: Not on file  . Number of children: Not on file  . Years of education: Not on file  . Highest  education level: Not on file  Occupational History  . Not on file  Social Needs  . Financial resource strain: Not on file  . Food insecurity:    Worry: Not on file    Inability: Not on file  . Transportation needs:    Medical: Not on file    Non-medical: Not on file  Tobacco Use  . Smoking status: Never Smoker  . Smokeless tobacco: Never Used  Substance and Sexual Activity  . Alcohol use: No  . Drug use: No  . Sexual activity: Not on file  Lifestyle  . Physical activity:    Days per week: Not on file    Minutes per session: Not on file  . Stress: Not on file  Relationships  . Social connections:    Talks on phone: Not on file    Gets together: Not on file    Attends religious service: Not on file    Active member of club or organization: Not on file    Attends meetings of clubs or organizations: Not on file    Relationship status: Not on file  . Intimate partner violence:    Fear of current or ex partner: Not on file    Emotionally abused: Not on file    Physically abused: Not on file    Forced sexual activity: Not on file  Other Topics Concern  . Not on file  Social History Narrative  . Not on file     BP 102/64   Pulse 73   Ht 5\' 4"  (1.626 m)   Wt 144 lb 9.6 oz (65.6 kg)   SpO2 99%   BMI 24.82 kg/m   Physical Exam:  Well appearing NAD HEENT: Unremarkable Neck:  No JVD, no thyromegally Lymphatics:  No adenopathy Back:  No CVA tenderness Lungs:  Clear with no wheezes HEART:  Regular rate rhythm, no murmurs, no rubs, no clicks Abd:  soft, positive bowel sounds, no organomegally, no rebound, no guarding Ext:  2 plus pulses, no edema, no cyanosis, no clubbing Skin:  No rashes no nodules Neuro:  CN II through XII intact, motor grossly intact  EKG NSR with non-specific T wave abnormalities.  Assess/Plan: 1. SVT - I have discussed the treatment options with the patient. She would like to continue to take an as needed flecainide. She will avoid caffeine.  I will see her back in 6 months.   Leonia Reeves.D.

## 2019-12-16 ENCOUNTER — Other Ambulatory Visit: Payer: Self-pay | Admitting: Internal Medicine

## 2019-12-16 MED ORDER — FLECAINIDE ACETATE 50 MG PO TABS: ORAL_TABLET | ORAL | 0 refills | Status: DC

## 2019-12-25 ENCOUNTER — Other Ambulatory Visit: Payer: Self-pay | Admitting: Internal Medicine

## 2020-08-26 ENCOUNTER — Encounter (HOSPITAL_COMMUNITY): Payer: Self-pay | Admitting: Emergency Medicine

## 2020-08-26 ENCOUNTER — Emergency Department (HOSPITAL_COMMUNITY)
Admission: EM | Admit: 2020-08-26 | Discharge: 2020-08-26 | Disposition: A | Discharge status: Home / Self Care | Payer: BC Managed Care – PPO | Attending: Emergency Medicine | Admitting: Emergency Medicine

## 2020-08-26 ENCOUNTER — Other Ambulatory Visit: Payer: Self-pay

## 2020-08-26 ENCOUNTER — Emergency Department (HOSPITAL_COMMUNITY): Payer: BC Managed Care – PPO

## 2020-08-26 DIAGNOSIS — Z79890 Hormone replacement therapy (postmenopausal): Secondary | ICD-10-CM | POA: Insufficient documentation

## 2020-08-26 DIAGNOSIS — E039 Hypothyroidism, unspecified: Secondary | ICD-10-CM | POA: Diagnosis not present

## 2020-08-26 DIAGNOSIS — I472 Ventricular tachycardia: Secondary | ICD-10-CM | POA: Diagnosis not present

## 2020-08-26 DIAGNOSIS — I471 Supraventricular tachycardia, unspecified: Secondary | ICD-10-CM

## 2020-08-26 DIAGNOSIS — R000 Tachycardia, unspecified: Secondary | ICD-10-CM | POA: Diagnosis present

## 2020-08-26 LAB — BASIC METABOLIC PANEL
Anion gap: 12 (ref 5–15)
BUN: 11 mg/dL (ref 6–20)
CO2: 15 mmol/L — ABNORMAL LOW (ref 22–32)
Calcium: 7.7 mg/dL — ABNORMAL LOW (ref 8.9–10.3)
Chloride: 109 mmol/L (ref 98–111)
Creatinine, Ser: 0.7 mg/dL (ref 0.44–1.00)
GFR calc Af Amer: >60 mL/min (ref >60)
GFR calc non Af Amer: >60 mL/min (ref >60)
Glucose, Bld: 128 mg/dL — ABNORMAL HIGH (ref 70–99)
Potassium: 4.2 mmol/L (ref 3.5–5.1)
Sodium: 136 mmol/L (ref 135–145)

## 2020-08-26 LAB — TROPONIN I (HIGH SENSITIVITY)
Troponin I (High Sensitivity): 30 ng/L — ABNORMAL HIGH (ref <18)
Troponin I (High Sensitivity): 151 ng/L (ref <18)

## 2020-08-26 LAB — CBC
HCT: 37.2 % (ref 36.0–46.0)
Hemoglobin: 11.8 g/dL — ABNORMAL LOW (ref 12.0–15.0)
MCH: 28.2 pg (ref 26.0–34.0)
MCHC: 31.7 g/dL (ref 30.0–36.0)
MCV: 89 fL (ref 80.0–100.0)
Platelets: 254 10*3/uL (ref 150–400)
RBC: 4.18 MIL/uL (ref 3.87–5.11)
RDW: 12.5 % (ref 11.5–15.5)
WBC: 7.8 10*3/uL (ref 4.0–10.5)
nRBC: 0 % (ref 0.0–0.2)

## 2020-08-26 LAB — MAGNESIUM: Magnesium: 1.7 mg/dL (ref 1.7–2.4)

## 2020-08-26 LAB — TSH: TSH: 3.642 u[IU]/mL (ref 0.350–4.500)

## 2020-08-26 MED ORDER — SODIUM CHLORIDE 0.9 % IV BOLUS
1000.0000 mL | Freq: Once | INTRAVENOUS | Status: AC
  Administered 2020-08-26: 1000 mL via INTRAVENOUS

## 2020-08-26 NOTE — ED Provider Notes (Signed)
[facility] EMERGENCY DEPARTMENT Provider Note   CSN: [id number] Arrival date & time: 08/26/20  0759     History Chief Complaint  Patient presents with  . SVT    Katie Howard is a 44 y.o. female.  HPI 44 year old female with history of anemia, anxiety, hypothyroidism, PDA, SVT on flecainide presents to the ER with an acute episode of SVT which had been converted after EMS arrived.  Patient states that she woke up around 6 AM this morning and felt her heart racing which she suspected was her SVT.  She stated that she also had an onset of right shoulder pain which was atypical for her episodes of SVT.  She states that she was in SVT for about 2 hours until EMS arrived, which per triage note states that they noted a heart rate in the 230s.  Patient was given 6 mg of adenosine, followed by 12 mg and then the patient converted.  She has been asymptomatic now.  She has no chest pain or right shoulder pain currently here in the ER.  She says she feels a little bit tired but overall feels well.  She is not sure what the cause of this episode was.  States she has not had an episode in almost 2-1/2 years.  She is followed by Dr. Ladona Ridgel with cardiology.  Denies any nausea, vomiting, headache, vision changes, abdominal pain, leg swelling, shortness of breath.    Past Medical History:  Diagnosis Date  . Anemia    Unspecified  . Anxiety   . Hypothyroidism   . Patent ductus arteriosus 2010   suspected per echocardiogram  . SVT (supraventricular tachycardia) (HCC)    OnRythmol; failed to tolerate flecainide    Patient Active Problem List   Diagnosis Date Noted  . Paroxysmal supraventricular tachycardia (HCC) 03/02/2014  . Headache(784.0) 04/02/2013  . OTHER SPECIFIED DISORDERS OF THYROID 12/19/2009  . SUPRAVENTRICULAR TACHYCARDIA 12/18/2009  . Patent ductus arteriosus 12/18/2009    Past Surgical History:  Procedure Laterality Date  . ABLATION  03/02/2014   AVNRT  ablation of multiple slow pathways by Dr Ladona Ridgel  . SUPRAVENTRICULAR TACHYCARDIA ABLATION N/A 03/02/2014   Procedure: SUPRAVENTRICULAR TACHYCARDIA ABLATION;  Surgeon: Marinus Maw, MD;  Location: [facility] CATH LAB;  Service: Cardiovascular;  Laterality: N/A;     OB History   No obstetric history on file.     Family History  Problem Relation Age of Onset  . Hypertension Mother   . Hypertension Father   . Healthy Sister   . Healthy Sister     Social History   Tobacco Use  . Smoking status: Never Smoker  . Smokeless tobacco: Never Used  Substance Use Topics  . Alcohol use: No  . Drug use: No    Home Medications Prior to Admission medications   Medication Sig Start Date End Date Taking? Authorizing Provider  flecainide (TAMBOCOR) 50 MG tablet TAKE UP TO 4 TABLETS BY MOUTH DAILY AS NEEDED FOR HEART RACING/PALPITATIONS. Please make overdue appt with Dr. Ladona Ridgel before anymore refills. 1st attempt Patient taking differently: Take 50-200 mg by mouth See admin instructions. Take 1-4 tablets as needed for heart racing/palpations. 12/16/19  Yes Marinus Maw, MD  SYNTHROID 112 MCG tablet Take 112 mcg by mouth every morning. 08/22/20  Yes [provider]    Allergies    Sulfonamide derivatives  Review of Systems   Review of Systems  Constitutional: Negative for chills and fever.  HENT: Negative for ear pain  and sore throat.   Eyes: Negative for pain and visual disturbance.  Respiratory: Negative for cough and shortness of breath.   Cardiovascular: Positive for palpitations. Negative for chest pain.  Gastrointestinal: Negative for abdominal pain and vomiting.  Genitourinary: Negative for dysuria and hematuria.  Musculoskeletal: Negative for arthralgias and back pain.  Skin: Negative for color change and rash.  Neurological: Negative for seizures and syncope.  All other systems reviewed and are negative.   Physical Exam Updated Vital Signs BP 90/65 (BP Location: Right Arm)    Pulse 91   Temp 98.3 F (36.8 C) (Oral)   Resp 20   SpO2 100%   Physical Exam Vitals and nursing note reviewed.  Constitutional:      General: She is not in acute distress.    Appearance: She is well-developed. She is not ill-appearing or diaphoretic.  HENT:     Head: Normocephalic and atraumatic.  Eyes:     Conjunctiva/sclera: Conjunctivae normal.  Cardiovascular:     Rate and Rhythm: Regular rhythm. Tachycardia present.     Pulses: Normal pulses.     Heart sounds: No murmur heard.      Comments: Mild tachycardia between 100-105; pt reports this is her baseline  Pulmonary:     Effort: Pulmonary effort is normal. No respiratory distress.     Breath sounds: Normal breath sounds.     Comments: Chest rise equal and unlabored, speaking in full sentences, no retractions, no evidence of respiratory distress Abdominal:     Palpations: Abdomen is soft.     Tenderness: There is no abdominal tenderness.  Musculoskeletal:     Cervical back: Neck supple.     Right lower leg: No edema.     Left lower leg: No edema.  Skin:    General: Skin is warm and dry.     Findings: No erythema or rash.  Neurological:     General: No focal deficit present.     Mental Status: She is alert and oriented to person, place, and time.     ED Results / Procedures / Treatments   Labs (all labs ordered are listed, but only abnormal results are displayed) Labs Reviewed  BASIC METABOLIC PANEL - Abnormal; Notable for the following components:      Result Value   CO2 15 (*)    Glucose, Bld 128 (*)    Calcium 7.7 (*)    All other components within normal limits  CBC - Abnormal; Notable for the following components:   Hemoglobin 11.8 (*)    All other components within normal limits  TROPONIN I (HIGH SENSITIVITY) - Abnormal; Notable for the following components:   Troponin I (High Sensitivity) 30 (*)    All other components within normal limits  TROPONIN I (HIGH SENSITIVITY) - Abnormal; Notable for  the following components:   Troponin I (High Sensitivity) 151 (*)    All other components within normal limits  MAGNESIUM  TSH    EKG EKG Interpretation  Date/Time:  Saturday August 26 2020 08:13:38 EDT Ventricular Rate:  105 PR Interval:    QRS Duration: 68 QT Interval:  383 QTC Calculation: 507 R Axis:   69 Text Interpretation: Sinus tachycardia Borderline abnrm T, anterolateral leads Borderline prolonged QT interval When compared to prior, longer QTC but now resolution of prior SVT. No STEMI Confirmed by Theda Belfast ([id number]) on 08/26/2020 8:32:08 AM   Radiology DG Chest Portable 1 View  Result Date: 08/26/2020 CLINICAL DATA:  SVT.  Chest tightness.  EXAM: PORTABLE CHEST 1 VIEW COMPARISON:  07/07/2017 FINDINGS: The heart size and mediastinal contours are within normal limits. Both lungs are clear. No visible pleural effusions. No pneumothorax. The visualized skeletal structures are unremarkable. IMPRESSION: No acute cardiopulmonary disease. Electronically Signed   By: Feliberto Harts MD   On: 08/26/2020 09:10    Procedures Procedures (including critical care time)  Medications Ordered in ED Medications  sodium chloride 0.9 % bolus 1,000 mL (0 mLs Intravenous Stopped 08/26/20 1114)    ED Course  I have reviewed the triage vital signs and the nursing notes.  Pertinent labs & imaging results that were available during my care of the patient were reviewed by me and considered in my medical decision making (see chart for details).    MDM Rules/Calculators/A&P                         44 year old female presents after SVT conversion. On presentation, she is alert, oriented, nontoxic-appearing, speaking full sentences without increased work of breathing, nondiaphoretic.  Your heart rate on arrival was between 100-105, which the patient states is her baseline.  This improved throughout the ED course, heart rate now in the 80s and 90s.  Blood pressure 106/76, patient unable  sure of what her baseline blood pressure is.  Other vitals are reassuring, O2 sats 100%.  Physical exam without lower extremity swelling, no chest wall tenderness, lung sounds clear.  CBC leukocytosis, hemoglobin of 11.8 with evidence of mild anemia.  BMP without any electrolyte abnormalities, glucose of 128, calcium 7.7 which appears to be at baseline.  Normal magnesium.  Troponin is elevated at 30, which is to be expected after 2 hours of SVT.  Second troponin pending.  TSH pending given patient's history of hypothyroidism.  Chest x-ray without acute abnormalities, EKG with slightly longer QTC than from previous, however SVT resolved, no sinus tach.  Patient was observed in the emergency department for approximately 4 hours, received 1 L fluid bolus with improvement in BP.  Pending second troponin, suspect that the patient will be stable for discharge with close cardiology follow-up.  11:58 AM: Repeat troponin 151.  This is to be expected given her SVT episode as per discussion with Dr. Rush Landmark.  Patient has finished her fluids and reports feeling well.  No repeat episodes of SVT.  At this stage in the ED course, the patient has been medically screened and is stable for discharge with close follow-up with cardiology.  Strict return precautions discussed.  She voiced understanding and is agreeable.  All of her questions have been answered to her satisfaction, she voices understanding and is agreeable.  At this stage in the ED course, the patient is medically screened and stable for discharge  Case discussed with Dr. Rush Landmark who is agreeable to the above plan and disposition.  Final Clinical Impression(s) / ED Diagnoses Final diagnoses:  SVT (supraventricular tachycardia) [facility])    Rx / DC Orders ED Discharge Orders    None       Leone Brand 08/26/20 1201    Tegeler, Canary Brim, MD 08/26/20 1520

## 2020-08-26 NOTE — ED Triage Notes (Signed)
Patient arrives to ED with complaints of sudden chest tightness at 6am this morning with pain radiating to her right shoulder. EMS arrived and found patient in SVT with a HR in the 230's. EMS gave 6mg  adenosine, then 12mg  adenosine and pt converted.

## 2020-08-26 NOTE — Discharge Instructions (Addendum)
Thank you for letting me take care of you here in the ER.  Please make sure to follow-up with cardiologist about the symptoms you are experiencing today.  Please make sure to return to the ER for any new or worsening symptoms.

## 2020-08-26 NOTE — ED Notes (Signed)
Patient verbalizes understanding of discharge instructions. Opportunity for questioning and answers were provided. Armband removed by staff, pt discharged from ED Ambulatory.. ° °

## 2020-08-30 NOTE — Progress Notes (Signed)
PCP:  Dorisann Frames, MD Primary Cardiologist: No primary care provider on file. Electrophysiologist: Lewayne Bunting, MD   Katie Howard is a 44 y.o. female seen today for Lewayne Bunting, MD for acute visit due to SVT.  Pt seen in ED 08/26/2020 for acute episode of SVT that converted after EMS arrived. She reported that she woke up around 6 AM that morning and felt her heart racing which she suspected was her SVT.  She stated that she also had an onset of right shoulder pain which was atypical for her episodes of SVT.  She states that she was in SVT for about 2 hours until EMS arrived. Patient was given 6 mg of adenosine, followed by 12 mg and then the patient converted.  She has been asymptomatic now.  She has no chest pain or right shoulder pain currently here in the ER.  She says she feels a little bit tired but overall feels well.  She is not sure what the cause of this episode was.  States she has not had an episode in almost 2-1/2 years  Since discharge from ED the patient reports doing well. She has had no further issue. She states she would not have gone to the ED if not for the arm pain, which was a new symptoms. She was concerned for a heart attack. The pain resolved immediately when she cardioverted. She has had no exertional chest pain. No SOB or syncope. Denies smoking or tobacco use. No change in diet. She had been working out in the yard in the days leading up to her episode.   Past Medical History:  Diagnosis Date  . Anemia    Unspecified  . Anxiety   . Hypothyroidism   . Patent ductus arteriosus 2010   suspected per echocardiogram  . SVT (supraventricular tachycardia) (HCC)    OnRythmol; failed to tolerate flecainide   Past Surgical History:  Procedure Laterality Date  . ABLATION  03/02/2014   AVNRT ablation of multiple slow pathways by Dr Ladona Ridgel  . SUPRAVENTRICULAR TACHYCARDIA ABLATION N/A 03/02/2014   Procedure: SUPRAVENTRICULAR TACHYCARDIA ABLATION;  Surgeon: Marinus Maw, MD;  Location: [facility] C[facility] LAB;  Service: Cardiovascular;  Laterality: N/A;    Current Outpatient Medications  Medication Sig Dispense Refill  . flecainide (TAMBOCOR) 50 MG tablet TAKE UP TO 4 TABLETS BY MOUTH DAILY AS NEEDED FOR HEART RACING/PALPITATIONS. Please make overdue appt with Dr. Ladona Ridgel before anymore refills. 1st attempt 120 tablet 0  . SYNTHROID 112 MCG tablet Take 112 mcg by mouth every morning.     No current facility-administered medications for this visit.    Allergies  Allergen Reactions  . Sulfonamide Derivatives Swelling    Social History   Socioeconomic History  . Marital status: Married    Spouse name: Not on file  . Number of children: Not on file  . Years of education: Not on file  . Highest education level: Not on file  Occupational History  . Not on file  Tobacco Use  . Smoking status: Never Smoker  . Smokeless tobacco: Never Used  Substance and Sexual Activity  . Alcohol use: No  . Drug use: No  . Sexual activity: Not on file  Other Topics Concern  . Not on file  Social History Narrative  . Not on file   Social Determinants of Health   Financial Resource Strain:   . Difficulty of Paying Living Expenses: Not on file  Food Insecurity:   . Worried About Running  Out of Food in the Last Year: Not on file  . Ran Out of Food in the Last Year: Not on file  Transportation Needs:   . Lack of Transportation (Medical): Not on file  . Lack of Transportation (Non-Medical): Not on file  Physical Activity:   . Days of Exercise per Week: Not on file  . Minutes of Exercise per Session: Not on file  Stress:   . Feeling of Stress : Not on file  Social Connections:   . Frequency of Communication with Friends and Family: Not on file  . Frequency of Social Gatherings with Friends and Family: Not on file  . Attends Religious Services: Not on file  . Active Member of Clubs or Organizations: Not on file  . Attends Banker Meetings: Not on  file  . Marital Status: Not on file  Intimate Partner Violence:   . Fear of Current or Ex-Partner: Not on file  . Emotionally Abused: Not on file  . Physically Abused: Not on file  . Sexually Abused: Not on file    Review of Systems: All other systems reviewed and are otherwise negative except as noted above.  Physical Exam: Vitals:   08/31/20 1256  BP: 106/70  Pulse: 87  SpO2: 97%  Weight: 157 lb 6.4 oz (71.4 kg)  Height: 5\' 5"  (1.651 m)    GEN- The patient is well appearing, alert and oriented x 3 today.   HEENT: normocephalic, atraumatic; sclera clear, conjunctiva pink; hearing intact; oropharynx clear; neck supple, no JVP Lymph- no cervical lymphadenopathy Lungs- Clear to ausculation bilaterally, normal work of breathing.  No wheezes, rales, rhonchi Heart- Regular rate and rhythm, no murmurs, rubs or gallops, PMI not laterally displaced GI- soft, non-tender, non-distended, bowel sounds present, no hepatosplenomegaly Extremities- no clubbing, cyanosis, or edema; DP/PT/radial pulses 2+ bilaterally MS- no significant deformity or atrophy Skin- warm and dry, no rash or lesion Psych- euthymic mood, full affect Neuro- strength and sensation are intact  EKG is ordered. Personal review of EKG from today shows NSR at 87 bpm.  Additional studies reviewed include: EMS run sheets, previous ablation notes, previous EP office notes  Strips from EMS run sheet      Assessment and Plan:  1. SVT  s/p ablation 2015 (very difficult per notes with some residual intermediate pathway conduction) Recent recurrence, which was the first in 2.5 years. Converted to sinus tachy after 6 then 12 of adenosine. Unfortunately actual conversion not seen in EMS run sheet.  Continue flecainide 50 mg as needed. She can take 100 mg followed by repeat 30-60 minutes after the first dose. If she continues to have symptoms after 200 mg total, should seek care at that point.   2. Right shoulder pain In  the setting of SVT. Suspect demand ischemia as resolved immediately on chemical cardioversion. She had very mild HS troponin elevation consistent with tachyarrhythmia. Previously normal echo and ETT Will order lexiscan myoview to r/o ischemia at baseline. She is highly concerned of ischemic component. Coronary CT scoring inappropriate in patient with any symptoms of pain per previous discussions with Dr. 2016.   RTC 3 months, or sooner pending Myoview.   Anne Fu, PA-C  08/31/20 12:59 PM

## 2020-08-31 ENCOUNTER — Other Ambulatory Visit: Payer: Self-pay

## 2020-08-31 ENCOUNTER — Ambulatory Visit (INDEPENDENT_AMBULATORY_CARE_PROVIDER_SITE_OTHER): Payer: BC Managed Care – PPO | Admitting: Student

## 2020-08-31 ENCOUNTER — Encounter: Payer: Self-pay | Admitting: Student

## 2020-08-31 VITALS — BP 106/70 | HR 87 | Ht 65.0 in | Wt 157.4 lb

## 2020-08-31 DIAGNOSIS — I471 Supraventricular tachycardia, unspecified: Secondary | ICD-10-CM

## 2020-08-31 DIAGNOSIS — R079 Chest pain, unspecified: Secondary | ICD-10-CM

## 2020-08-31 MED ORDER — FLECAINIDE ACETATE 50 MG PO TABS: ORAL_TABLET | ORAL | 0 refills | Status: DC

## 2020-08-31 NOTE — Patient Instructions (Signed)
Medication Instructions:  *If you need a refill on your cardiac medications before your next appointment, please call your pharmacy*  Testing/Procedures: Your physician has requested that you have a lexiscan myoview. For further information please visit [email or web address] Please follow instruction sheet, as given.  Follow-Up: At [facility], you and your health needs are our priority.  As part of our continuing mission to provide you with exceptional heart care, we have created designated Provider Care Teams.  These Care Teams include your primary Cardiologist (physician) and Advanced Practice Providers (APPs -  Physician Assistants and Nurse Practitioners) who all work together to provide you with the care you need, when you need it.  We recommend signing up for the patient portal called "MyChart".  Sign up information is provided on this After Visit Summary.  MyChart is used to connect with patients for Virtual Visits (Telemedicine).  Patients are able to view lab/test results, encounter notes, upcoming appointments, etc.  Non-urgent messages can be sent to your provider as well.   To learn more about what you can do with MyChart, go to [email or web address].    Your next appointment:   Your physician recommends that you schedule a follow-up appointment in: 3 MONTHS with Dr. Ladona Ridgel  The format for your next appointment:   In Person with Lewayne Bunting, MD

## 2020-09-06 ENCOUNTER — Telehealth (HOSPITAL_COMMUNITY): Payer: Self-pay | Admitting: *Deleted

## 2020-09-06 NOTE — Telephone Encounter (Signed)
Patient given detailed instructions per Myocardial Perfusion Study Information Sheet for the test on 09/08/20 at 10:15. Patient notified to arrive 15 minutes early and that it is imperative to arrive on time for appointment to keep from having the test rescheduled.  If you need to cancel or reschedule your appointment, please call the office within 24 hours of your appointment. . Patient verbalized understanding.Katie Howard

## 2020-09-08 ENCOUNTER — Other Ambulatory Visit: Payer: Self-pay

## 2020-09-08 ENCOUNTER — Ambulatory Visit (HOSPITAL_COMMUNITY): Payer: BC Managed Care – PPO | Attending: Cardiology

## 2020-09-08 DIAGNOSIS — R079 Chest pain, unspecified: Secondary | ICD-10-CM | POA: Diagnosis present

## 2020-09-08 LAB — MYOCARDIAL PERFUSION IMAGING
LV dias vol: 58 mL (ref 46–106)
LV sys vol: 23 mL
Peak HR: 127 {beats}/min
Rest HR: 75 {beats}/min
SDS: 1
SRS: 2
SSS: 4
TID: 0.97

## 2020-09-08 MED ORDER — TECHNETIUM TC 99M TETROFOSMIN IV KIT
10.1000 | PACK | Freq: Once | INTRAVENOUS | Status: AC | PRN
  Administered 2020-09-08: 10.1 via INTRAVENOUS
  Filled 2020-09-08: qty 11

## 2020-09-08 MED ORDER — REGADENOSON 0.4 MG/5ML IV SOLN
0.4000 mg | Freq: Once | INTRAVENOUS | Status: AC
  Administered 2020-09-08: 0.4 mg via INTRAVENOUS

## 2020-09-08 MED ORDER — TECHNETIUM TC 99M TETROFOSMIN IV KIT
31.5000 | PACK | Freq: Once | INTRAVENOUS | Status: AC | PRN
  Administered 2020-09-08: 31.5 via INTRAVENOUS
  Filled 2020-09-08: qty 32

## 2020-09-12 ENCOUNTER — Telehealth: Payer: Self-pay

## 2020-09-12 NOTE — Telephone Encounter (Signed)
-----   Message from Graciella Freer, PA-C sent at 09/12/2020  9:45 AM EDT ----- Please let her know that her heart function is normal and her stress test was read as a "low risk" study!   Thank you!

## 2020-09-12 NOTE — Telephone Encounter (Signed)
Released normal results via Mychart. Instructed patient to call the office if any questions or if she would like to go over results in depth.

## 2020-09-22 ENCOUNTER — Other Ambulatory Visit: Payer: Self-pay | Admitting: Student

## 2020-10-14 ENCOUNTER — Other Ambulatory Visit: Payer: Self-pay

## 2020-10-14 ENCOUNTER — Emergency Department (HOSPITAL_COMMUNITY)
Admission: EM | Admit: 2020-10-14 | Discharge: 2020-10-14 | Disposition: A | Discharge status: Home / Self Care | Payer: BC Managed Care – PPO | Attending: Emergency Medicine | Admitting: Emergency Medicine

## 2020-10-14 DIAGNOSIS — I471 Supraventricular tachycardia: Secondary | ICD-10-CM | POA: Insufficient documentation

## 2020-10-14 DIAGNOSIS — Z79899 Other long term (current) drug therapy: Secondary | ICD-10-CM | POA: Diagnosis not present

## 2020-10-14 DIAGNOSIS — R002 Palpitations: Secondary | ICD-10-CM | POA: Diagnosis present

## 2020-10-14 DIAGNOSIS — E039 Hypothyroidism, unspecified: Secondary | ICD-10-CM | POA: Diagnosis not present

## 2020-10-14 LAB — BASIC METABOLIC PANEL
Anion gap: 10 (ref 5–15)
BUN: 5 mg/dL — ABNORMAL LOW (ref 6–20)
CO2: 20 mmol/L — ABNORMAL LOW (ref 22–32)
Calcium: 8.5 mg/dL — ABNORMAL LOW (ref 8.9–10.3)
Chloride: 108 mmol/L (ref 98–111)
Creatinine, Ser: 0.72 mg/dL (ref 0.44–1.00)
GFR, Estimated: >60 mL/min (ref >60)
Glucose, Bld: 153 mg/dL — ABNORMAL HIGH (ref 70–99)
Potassium: 4.4 mmol/L (ref 3.5–5.1)
Sodium: 138 mmol/L (ref 135–145)

## 2020-10-14 LAB — CBC WITH DIFFERENTIAL/PLATELET
Abs Immature Granulocytes: 0.06 10*3/uL (ref 0.00–0.07)
Basophils Absolute: 0 10*3/uL (ref 0.0–0.1)
Basophils Relative: 0 %
Eosinophils Absolute: 0 10*3/uL (ref 0.0–0.5)
Eosinophils Relative: 0 %
HCT: 38.5 % (ref 36.0–46.0)
Hemoglobin: 12.5 g/dL (ref 12.0–15.0)
Immature Granulocytes: 1 %
Lymphocytes Relative: 10 %
Lymphs Abs: 1.2 10*3/uL (ref 0.7–4.0)
MCH: 28.7 pg (ref 26.0–34.0)
MCHC: 32.5 g/dL (ref 30.0–36.0)
MCV: 88.5 fL (ref 80.0–100.0)
Monocytes Absolute: 0.4 10*3/uL (ref 0.1–1.0)
Monocytes Relative: 3 %
Neutro Abs: 9.9 10*3/uL — ABNORMAL HIGH (ref 1.7–7.7)
Neutrophils Relative %: 86 %
Platelets: 297 10*3/uL (ref 150–400)
RBC: 4.35 MIL/uL (ref 3.87–5.11)
RDW: 12.5 % (ref 11.5–15.5)
WBC: 11.6 10*3/uL — ABNORMAL HIGH (ref 4.0–10.5)
nRBC: 0 % (ref 0.0–0.2)

## 2020-10-14 MED ORDER — ADENOSINE 6 MG/2ML IV SOLN
INTRAVENOUS | Status: AC
  Administered 2020-10-14: 6 mg
  Filled 2020-10-14: qty 2

## 2020-10-14 MED ORDER — ADENOSINE 6 MG/2ML IV SOLN: 6.0000 mg | Freq: Once | INTRAVENOUS | Status: DC

## 2020-10-14 MED ORDER — ADENOSINE 6 MG/2ML IV SOLN
INTRAVENOUS | Status: AC
  Filled 2020-10-14: qty 2

## 2020-10-14 MED ORDER — DILTIAZEM HCL 25 MG/5ML IV SOLN
5.0000 mg | Freq: Once | INTRAVENOUS | Status: AC
  Administered 2020-10-14: 5 mg via INTRAVENOUS
  Filled 2020-10-14: qty 5

## 2020-10-14 MED ORDER — FLECAINIDE ACETATE 100 MG PO TABS
100.0000 mg | ORAL_TABLET | Freq: Once | ORAL | Status: AC
  Administered 2020-10-14: 100 mg via ORAL
  Filled 2020-10-14: qty 1

## 2020-10-14 NOTE — ED Notes (Addendum)
Pt's heart rate went down to 104 and is now on NSR. Adenosine on hold at this time. MD @ bedside.

## 2020-10-14 NOTE — ED Triage Notes (Signed)
BIB EMS for SVT, took flecanaide without relief. Hx of SVT for 14 years, ablation 2014. Pt c/o tightness.

## 2020-10-14 NOTE — ED Notes (Signed)
Cardiologist at bedside.  

## 2020-10-14 NOTE — Discharge Instructions (Addendum)
Follow-up with Dr. Johney Frame as he has instructed you

## 2020-10-14 NOTE — ED Notes (Signed)
Pt converted to NSR. Dr. Freida Busman and cardiologist aware.

## 2020-10-14 NOTE — Consult Note (Signed)
ELECTROPHYSIOLOGY CONSULT NOTE    Primary Care Physician: Dorisann Frames, MD Referring Physician:  Dr Freida Busman  Admit Date: 10/14/2020  Reason for consultation:  SVT  Katie Howard is a 44 y.o. female with a h/o known short RP SVT who presents with recurrence of SVT. She is well known to Dr Ladona Ridgel.  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  She had extensive ablation performed to the slow pathway region but had persistence of SVT.  She has taken flecainide on a prn basis since that time. She reports abrupt onset of short RP SVT at 3am today.  She took flecainide 50mg  PO twice without termination.  She now presents and is observed to have short RP SVT which will terminate but immediately resume. She follows with Dr as an outpatient. She reports palpitations and nervousness today.  Denies CP.    Past Medical History:  Diagnosis Date  . Anemia    Unspecified  . Anxiety   . Hypothyroidism   . Patent ductus arteriosus 2010   suspected per echocardiogram  . SVT (supraventricular tachycardia) (HCC)    OnRythmol; failed to tolerate flecainide   Past Surgical History:  Procedure Laterality Date  . ABLATION  03/02/2014   AVNRT ablation of multiple slow pathways by Dr 05/02/2014  . SUPRAVENTRICULAR TACHYCARDIA ABLATION N/A 03/02/2014   Procedure: SUPRAVENTRICULAR TACHYCARDIA ABLATION;  Surgeon: 05/02/2014, MD;  Location: [facility] CATH LAB;  Service: Cardiovascular;  Laterality: N/A;    . adenosine      . adenosine         Allergies  Allergen Reactions  . Sulfonamide Derivatives Swelling    Social History   Socioeconomic History  . Marital status: Married    Spouse name: Not on file  . Number of children: Not on file  . Years of education: Not on file  . Highest education level: Not on file  Occupational History  . Not on file  Tobacco Use  . Smoking status: Never Smoker  . Smokeless tobacco:  Never Used  Substance and Sexual Activity  . Alcohol use: No  . Drug use: No  . Sexual activity: Not on file  Other Topics Concern  . Not on file  Social History Narrative  . Not on file   Social Determinants of Health   Financial Resource Strain:   . Difficulty of Paying Living Expenses: Not on file  Food Insecurity:   . Worried About [facility] in the Last Year: Not on file  . Ran Out of Food in the Last Year: Not on file  Transportation Needs:   . Lack of Transportation (Medical): Not on file  . Lack of Transportation (Non-Medical): Not on file  Physical Activity:   . Days of Exercise per Week: Not on file  . Minutes of Exercise per Session: Not on file  Stress:   . Feeling of Stress : Not on file  Social Connections:   . Frequency of Communication with Friends and Family: Not on file  . Frequency of Social Gatherings with Friends and Family: Not on file  . Attends Religious Services: Not on file  . Active Member of Clubs or Organizations: Not on file  . Attends Programme researcher, broadcasting/film/video Meetings: Not on file  . Marital Status: Not on file  Intimate Partner Violence:   . Fear of Current or Ex-Partner: Not on file  . Emotionally Abused: Not on file  . Physically  Abused: Not on file  . Sexually Abused: Not on file    Family History  Problem Relation Age of Onset  . Hypertension Mother   . Hypertension Father   . Healthy Sister   . Healthy Sister     ROS- All systems are reviewed and negative except as per the HPI above  Physical Exam: Telemetry: Vitals:   10/14/20 1058 10/14/20 1100 10/14/20 1130 10/14/20 1145  BP:  110/89 105/83   Pulse: (!) 200 (!) 203 (!) 193 (!) 183  Resp: (!) 26 (!) 21 20 (!) 27  Temp:      TempSrc:      SpO2: 100% 100% 100% 100%  Weight:      Height:        GEN- The patient is well appearing, alert and oriented x 3 today.   Head- normocephalic, atraumatic Eyes-  Sclera clear, conjunctiva pink Ears- hearing  intact Oropharynx- clear Neck- supple  Lungs-   normal work of breathing Heart- tachycardic GI- soft  Extremities- no clubbing, cyanosis, or edema MS- no significant deformity or atrophy Skin- no rash or lesion Psych- euthymic mood, full affect Neuro- strength and sensation are intact  EKG-  SVT at 196 bpm  Labs:   Lab Results  Component Value Date   WBC 11.6 (H) 10/14/2020   HGB 12.5 10/14/2020   HCT 38.5 10/14/2020   MCV 88.5 10/14/2020   PLT 297 10/14/2020    Recent Labs  Lab 10/14/20 1104  NA 138  K 4.4  CL 108  CO2 20*  BUN 5*  CREATININE 0.72  CALCIUM 8.5*  GLUCOSE 153*    Echo 2017:  Normal EF  ASSESSMENT AND PLAN:   1. SVT She has known SVT and is s/p prior ablation by Dr Ladona Ridgel in 2015.  As she has cessation of SVT spontaneously with immediate reinitiation, I will give flecainide 100mg  PO x 1 now and IV diltiazem.  Once in her system, if she does not convert then we will give adenosine.  If we can control her SVT then she could go home today.  If not then we may need to consider admission for possible SVT ablation by Dr on Monday.  EP to follow with ED team.  Tuesday, MD 10/14/2020  12:03 PM    Addendum: Pt received IV adenosine and is now back in sinus  Plan: If no further SVT, ok to discharge at 1500 Start flecainide 50mg  BID at discharge  I will arrange follow-up with Dr 10/16/2020 in next few weeks to reconsider ablation.  MD, [facility] 10/14/2020 1:43 PM

## 2020-10-14 NOTE — ED Notes (Signed)
Post adenoside ECG reviewed by Dr. Freida Busman and cardiologist

## 2020-10-14 NOTE — ED Provider Notes (Signed)
[facility] EMERGENCY DEPARTMENT Provider Note   CSN: [id number] Arrival date & time: 10/14/20  1034     History Chief Complaint  Patient presents with  . Palpitations    Katie Howard is a 44 y.o. female.  44 year old female with history of SVT, status post ablation, who presents with palpitations that began around 7 hours ago.  Patient periodically gets SVT and took flecainide at around that time.  Since that time she continues to be in and out of SVT.  Has had palpitations and some diaphoresis but denies any chest pain.  EMS was called and patient continues to have transient SVT.  She was not given any medications and transported here.  Patient denies any changes to her medications.  States that she does not take any medications daily for this.        Past Medical History:  Diagnosis Date  . Anemia    Unspecified  . Anxiety   . Hypothyroidism   . Patent ductus arteriosus 2010   suspected per echocardiogram  . SVT (supraventricular tachycardia) (HCC)    OnRythmol; failed to tolerate flecainide    Patient Active Problem List   Diagnosis Date Noted  . Paroxysmal supraventricular tachycardia (HCC) 03/02/2014  . Headache(784.0) 04/02/2013  . OTHER SPECIFIED DISORDERS OF THYROID 12/19/2009  . SUPRAVENTRICULAR TACHYCARDIA 12/18/2009  . Patent ductus arteriosus 12/18/2009    Past Surgical History:  Procedure Laterality Date  . ABLATION  03/02/2014   AVNRT ablation of multiple slow pathways by Dr Ladona Ridgel  . SUPRAVENTRICULAR TACHYCARDIA ABLATION N/A 03/02/2014   Procedure: SUPRAVENTRICULAR TACHYCARDIA ABLATION;  Surgeon: Marinus Maw, MD;  Location: [facility] CATH LAB;  Service: Cardiovascular;  Laterality: N/A;     OB History   No obstetric history on file.     Family History  Problem Relation Age of Onset  . Hypertension Mother   . Hypertension Father   . Healthy Sister   . Healthy Sister     Social History   Tobacco Use  . Smoking  status: Never Smoker  . Smokeless tobacco: Never Used  Substance Use Topics  . Alcohol use: No  . Drug use: No    Home Medications Prior to Admission medications   Medication Sig Start Date End Date Taking? Authorizing Provider  flecainide (TAMBOCOR) 50 MG tablet TAKE UP TO 4 TABLETS BY MOUTH DAILY AS NEEDED FOR HEART RACING/PALPITATIONS. Please make overdue appt with Dr. Ladona Ridgel before anymore refills. 1st attempt 08/31/20   Graciella Freer, PA-C  SYNTHROID 112 MCG tablet Take 112 mcg by mouth every morning. 08/22/20   [provider]    Allergies    Sulfonamide derivatives  Review of Systems   Review of Systems  All other systems reviewed and are negative.   Physical Exam Updated Vital Signs There were no vitals taken for this visit.  Physical Exam Vitals and nursing note reviewed.  Constitutional:      General: She is not in acute distress.    Appearance: Normal appearance. She is well-developed. She is not toxic-appearing.  HENT:     Head: Normocephalic and atraumatic.  Eyes:     General: Lids are normal.     Conjunctiva/sclera: Conjunctivae normal.     Pupils: Pupils are equal, round, and reactive to light.  Neck:     Thyroid: No thyroid mass.     Trachea: No tracheal deviation.  Cardiovascular:     Rate and Rhythm: Regular rhythm. Tachycardia present.  Heart sounds: Normal heart sounds. No murmur heard.  No gallop.   Pulmonary:     Effort: Pulmonary effort is normal. No respiratory distress.     Breath sounds: Normal breath sounds. No stridor. No decreased breath sounds, wheezing, rhonchi or rales.  Abdominal:     General: Bowel sounds are normal. There is no distension.     Palpations: Abdomen is soft.     Tenderness: There is no abdominal tenderness. There is no rebound.  Musculoskeletal:        General: No tenderness. Normal range of motion.     Cervical back: Normal range of motion and neck supple.  Skin:    General: Skin is warm and  dry.     Findings: No abrasion or rash.  Neurological:     Mental Status: She is alert and oriented to person, place, and time.     GCS: GCS eye subscore is 4. GCS verbal subscore is 5. GCS motor subscore is 6.     Cranial Nerves: No cranial nerve deficit.     Sensory: No sensory deficit.  Psychiatric:        Speech: Speech normal.        Behavior: Behavior normal.     ED Results / Procedures / Treatments   Labs (all labs ordered are listed, but only abnormal results are displayed) Labs Reviewed - No data to display  EKG EKG Interpretation  Date/Time:  Saturday October 14 2020 12:44:51 EST Ventricular Rate:  131 PR Interval:    QRS Duration: 86 QT Interval:  361 QTC Calculation: 533 R Axis:   89 Text Interpretation: Sinus tachycardia Prolonged PR interval Probable left atrial enlargement Anterior infarct, age indeterminate Prolonged QT interval improved from prior Confirmed by Lorre Nick ([id number]) on 10/14/2020 2:32:08 PM   Radiology No results found.  Procedures Procedures (including critical care time)  Medications Ordered in ED Medications - No data to display  ED Course  I have reviewed the triage vital signs and the nursing notes.  Pertinent labs & imaging results that were available during my care of the patient were reviewed by me and considered in my medical decision making (see chart for details).    MDM Rules/Calculators/A&P                          Case discussed with cardiology, Dr. Johney Frame, recommends patient receive Cardizem 5 mg along with flecainide 100 mg.  This was done and patient's SVT did not improve.  She was subsequently given 6 mg of adenosine IV push.  She converted back to sinus rhythm.  Was monitored here for several hours and remained in sinus rhythm.  Dr. Johney Frame will add medications to her current list.  She is stable for discharge  CRITICAL CARE Performed by: Toy Baker Total critical care time: 55 minutes Critical care time  was exclusive of separately billable procedures and treating other patients. Critical care was necessary to treat or prevent imminent or life-threatening deterioration. Critical care was time spent personally by me on the following activities: development of treatment plan with patient and/or surrogate as well as nursing, discussions with consultants, evaluation of patient's response to treatment, examination of patient, obtaining history from patient or surrogate, ordering and performing treatments and interventions, ordering and review of laboratory studies, ordering and review of radiographic studies, pulse oximetry and re-evaluation of patient's condition.  Final Clinical Impression(s) / ED Diagnoses Final diagnoses:  None  Rx / DC Orders ED Discharge Orders    None       Lorre Nick, MD 10/14/20 1433

## 2020-10-16 ENCOUNTER — Telehealth: Payer: Self-pay | Admitting: Student

## 2020-10-16 NOTE — Telephone Encounter (Signed)
Quintana is calling requesting a nurse call her to discuss her Lexiscan results. Please advise.

## 2020-10-16 NOTE — Telephone Encounter (Signed)
Follow Up:      Pt says she is returning a call from today. Did not know who called her.I did

## 2020-10-18 ENCOUNTER — Encounter: Payer: Self-pay | Admitting: Internal Medicine

## 2020-10-18 ENCOUNTER — Ambulatory Visit (INDEPENDENT_AMBULATORY_CARE_PROVIDER_SITE_OTHER): Payer: BC Managed Care – PPO | Admitting: Internal Medicine

## 2020-10-18 ENCOUNTER — Other Ambulatory Visit: Payer: Self-pay

## 2020-10-18 VITALS — BP 110/70 | HR 88 | Ht 65.0 in | Wt 152.0 lb

## 2020-10-18 DIAGNOSIS — I471 Supraventricular tachycardia: Secondary | ICD-10-CM

## 2020-10-18 MED ORDER — FLECAINIDE ACETATE 50 MG PO TABS: 50.0000 mg | ORAL_TABLET | Freq: Two times a day (BID) | ORAL | 3 refills | Status: DC

## 2020-10-18 NOTE — Telephone Encounter (Signed)
Pt has f/u appointment today with Dr Ladona Ridgel for ED follow up.

## 2020-10-18 NOTE — Progress Notes (Signed)
HPI Katie Howard returns today for followup. She is a pleasant 44 yo woman with SVT who underwent EP study and catheter ablation almost 7 years ago. 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 She initially did well but developed more SVT and was placed on propafenone which resulted in a metallic taste. She has had worsening symptoms and was in the ED this past weekend and had incessant SVT. She was given flecainide and then IV adenosine and has done well since. She is appropriately anxious as is her husband.   Allergies  Allergen Reactions  . Sulfonamide Derivatives Swelling     Current Outpatient Medications  Medication Sig Dispense Refill  . SYNTHROID 112 MCG tablet Take 112 mcg by mouth every morning.    . flecainide (TAMBOCOR) 50 MG tablet Take 1 tablet (50 mg total) by mouth 2 (two) times daily. 180 tablet 3   No current facility-administered medications for this visit.     Past Medical History:  Diagnosis Date  . Anemia    Unspecified  . Anxiety   . Hypothyroidism   . Patent ductus arteriosus 2010   suspected per echocardiogram  . SVT (supraventricular tachycardia) (HCC)    OnRythmol; failed to tolerate flecainide    ROS:   All systems reviewed and negative except as noted in the HPI.   Past Surgical History:  Procedure Laterality Date  . ABLATION  03/02/2014   AVNRT ablation of multiple slow pathways by Dr Ladona Ridgel  . SUPRAVENTRICULAR TACHYCARDIA ABLATION N/A 03/02/2014   Procedure: SUPRAVENTRICULAR TACHYCARDIA ABLATION;  Surgeon: Marinus Maw, MD;  Location: [facility] CATH LAB;  Service: Cardiovascular;  Laterality: N/A;     Family History  Problem Relation Age of Onset  . Hypertension Mother   . Hypertension Father   . Healthy Sister   . Healthy Sister      Social History   Socioeconomic  History  . Marital status: Married    Spouse name: Not on file  . Number of children: Not on file  . Years of education: Not on file  . Highest education level: Not on file  Occupational History  . Not on file  Tobacco Use  . Smoking status: Never Smoker  . Smokeless tobacco: Never Used  Substance and Sexual Activity  . Alcohol use: No  . Drug use: No  . Sexual activity: Not on file  Other Topics Concern  . Not on file  Social History Narrative  . Not on file   Social Determinants of Health   Financial Resource Strain:   . Difficulty of Paying Living Expenses: Not on file  Food Insecurity:   . Worried About Programme researcher, broadcasting/film/video in the Last Year: Not on file  . Ran Out of Food in the Last Year: Not on file  Transportation Needs:   . Lack of Transportation (Medical): Not on file  . Lack of Transportation (Non-Medical): Not on file  Physical Activity:   . Days of Exercise per Week: Not on file  . Minutes of Exercise per Session: Not on file  Stress:   . Feeling of Stress : Not on file  Social Connections:   . Frequency of Communication with Friends and Family: Not on file  . Frequency of Social Gatherings with Friends and Family: Not on file  .  Attends Religious Services: Not on file  . Active Member of Clubs or Organizations: Not on file  . Attends Banker Meetings: Not on file  . Marital Status: Not on file  Intimate Partner Violence:   . Fear of Current or Ex-Partner: Not on file  . Emotionally Abused: Not on file  . Physically Abused: Not on file  . Sexually Abused: Not on file     BP 110/70   Pulse 88   Ht 5\' 5"  (1.651 m)   Wt 152 lb (68.9 kg)   LMP 09/30/2020 (Within Days)   SpO2 99%   BMI 25.29 kg/m   Physical Exam:  Well appearing NAD HEENT: Unremarkable Neck:  No JVD, no thyromegally Lymphatics:  No adenopathy Back:  No CVA tenderness Lungs:  Clear with no wheezes HEART:  Regular rate rhythm, no murmurs, no rubs, no clicks Abd:   soft, positive bowel sounds, no organomegally, no rebound, no guarding Ext:  2 plus pulses, no edema, no cyanosis, no clubbing Skin:  No rashes no nodules Neuro:  CN II through XII intact, motor grossly intact  EKG - nsr  Assess/Plan: 1. SVT - I have discussed the treatment options in detail. The risks/benefits/goals/expectations of repeat ablation with the high likelihood of PPM were discussed as was the use of daily flecainide. She will try the flecainide first. If she is intolerant or has more SVT then we will consider ablation. I spent over 30 minutes including 50% face to face time with the patient and her husband answering all questions.  10/02/2020 Katie Tarrant,MD

## 2020-10-18 NOTE — Patient Instructions (Addendum)
Medication Instructions:  Your physician has recommended you make the following change in your medication:   1.  START taking flecainide 50 mg - Take one tablet by mouth twice a day   Labwork: None ordered.  Testing/Procedures: None ordered.  Follow-Up:  Please schedule for a nurse visit in 2 weeks.  Your physician wants you to follow-up in: 2 months with Dr. Ladona Ridgel.    Any Other Special Instructions Will Be Listed Below (If Applicable).  If you need a refill on your cardiac medications before your next appointment, please call your pharmacy.

## 2020-10-19 ENCOUNTER — Ambulatory Visit: Payer: BC Managed Care – PPO | Admitting: Nurse Practitioner

## 2020-11-01 ENCOUNTER — Ambulatory Visit: Payer: BC Managed Care – PPO

## 2020-12-08 ENCOUNTER — Other Ambulatory Visit: Payer: Self-pay

## 2020-12-08 ENCOUNTER — Encounter: Payer: Self-pay | Admitting: Internal Medicine

## 2020-12-08 ENCOUNTER — Ambulatory Visit: Payer: 59 | Admitting: Internal Medicine

## 2020-12-08 ENCOUNTER — Ambulatory Visit: Payer: BC Managed Care – PPO | Admitting: Internal Medicine

## 2020-12-08 VITALS — BP 108/64 | HR 89 | Ht 65.0 in | Wt 162.2 lb

## 2020-12-08 DIAGNOSIS — I471 Supraventricular tachycardia: Secondary | ICD-10-CM

## 2020-12-08 MED ORDER — FLECAINIDE ACETATE 50 MG PO TABS
50.0000 mg | ORAL_TABLET | Freq: Two times a day (BID) | ORAL | 3 refills | Status: DC
Start: 2020-12-08 — End: 2021-12-21

## 2020-12-08 NOTE — Progress Notes (Signed)
HPI Mrs. Sorce returns today for followup. He is a pleasant 45 yo woman with SVT s/p catheter ablation remotely, who has had recurrent episodes of SVT. The worst of which was 2 months ago. Since then she has been stable. She states that at that time she was under worsening stress which has improved.  Allergies  Allergen Reactions  . Sulfonamide Derivatives Swelling     Current Outpatient Medications  Medication Sig Dispense Refill  . flecainide (TAMBOCOR) 50 MG tablet Take 1 tablet (50 mg total) by mouth 2 (two) times daily. 180 tablet 3  . SYNTHROID 125 MCG tablet Take 125 mcg by mouth daily.     No current facility-administered medications for this visit.     Past Medical History:  Diagnosis Date  . Anemia    Unspecified  . Anxiety   . Hypothyroidism   . Patent ductus arteriosus 2010   suspected per echocardiogram  . SVT (supraventricular tachycardia) (HCC)    OnRythmol; failed to tolerate flecainide    ROS:   All systems reviewed and negative except as noted in the HPI.   Past Surgical History:  Procedure Laterality Date  . ABLATION  03/02/2014   AVNRT ablation of multiple slow pathways by Dr Ladona Ridgel  . SUPRAVENTRICULAR TACHYCARDIA ABLATION N/A 03/02/2014   Procedure: SUPRAVENTRICULAR TACHYCARDIA ABLATION;  Surgeon: Marinus Maw, MD;  Location: [facility] CATH LAB;  Service: Cardiovascular;  Laterality: N/A;     Family History  Problem Relation Age of Onset  . Hypertension Mother   . Hypertension Father   . Healthy Sister   . Healthy Sister      Social History   Socioeconomic History  . Marital status: Married    Spouse name: Not on file  . Number of children: Not on file  . Years of education: Not on file  . Highest education level: Not on file  Occupational History  . Not on file  Tobacco Use  . Smoking status: Never Smoker  . Smokeless tobacco: Never Used  Substance and Sexual Activity  . Alcohol use: No  . Drug use: No  . Sexual activity:  Not on file  Other Topics Concern  . Not on file  Social History Narrative  . Not on file   Social Determinants of Health   Financial Resource Strain: Not on file  Food Insecurity: Not on file  Transportation Needs: Not on file  Physical Activity: Not on file  Stress: Not on file  Social Connections: Not on file  Intimate Partner Violence: Not on file     BP 108/64   Pulse 89   Ht 5\' 5"  (1.651 m)   Wt 162 lb 3.2 oz (73.6 kg)   SpO2 99%   BMI 26.99 kg/m   Physical Exam:  Well appearing NAD HEENT: Unremarkable Neck:  No JVD, no thyromegally Lymphatics:  No adenopathy Back:  No CVA tenderness Lungs:  Clear with no wheezes HEART:  Regular rate rhythm, no murmurs, no rubs, no clicks Abd:  soft, positive bowel sounds, no organomegally, no rebound, no guarding Ext:  2 plus pulses, no edema, no cyanosis, no clubbing Skin:  No rashes no nodules Neuro:  CN II through XII intact, motor grossly intact  EKG - NSR  Assess/Plan: 1. SVT - I have discussed the treatment options with the patient. She would like to continue medical therapy. She had a slow pathway modification previously and we ablated very close to her AV node and also  ablated on the left side of Koch's triangle. Additional ablation would likely end up resulting in heart block. She would like to hold of on ablation for now. 2. Weight gain - she admits to some dietary indiscretion and would like to lose weight. I spent 40 minutes including over 50% face to face time with the patient.  Loman Chroman Brendaliz Kuk,MD

## 2020-12-08 NOTE — Patient Instructions (Signed)

## 2020-12-22 ENCOUNTER — Other Ambulatory Visit: Payer: Self-pay

## 2020-12-22 ENCOUNTER — Telehealth: Payer: Self-pay | Admitting: Internal Medicine

## 2020-12-22 ENCOUNTER — Other Ambulatory Visit: Payer: 59

## 2020-12-22 DIAGNOSIS — Z20822 Contact with and (suspected) exposure to covid-19: Secondary | ICD-10-CM

## 2020-12-22 NOTE — Telephone Encounter (Signed)
Pt c/o medication issue:  1. Name of Medication: flecainide (TAMBOCOR) 50 MG tablet  2. How are you currently taking this medication (dosage and times per day)? As directed   3. Are you having a reaction (difficulty breathing--STAT)? no  4. What is your medication issue? Patient wanted to know if she can take a baby aspirin 2x daily while taking this medication. Please advise

## 2020-12-22 NOTE — Telephone Encounter (Signed)
Returned call to Pt.  Pt is asking if she can take Aspirin for covid symptoms.  Advised would be better if she took tylenol since there was bleeding risk and upset stomach risk with ASA.  Pt indicates understanding.

## 2020-12-23 LAB — SARS-COV-2, NAA 2 DAY TAT

## 2020-12-23 LAB — NOVEL CORONAVIRUS, NAA: SARS-CoV-2, NAA: DETECTED — AB

## 2021-01-04 ENCOUNTER — Ambulatory Visit: Payer: BC Managed Care – PPO | Admitting: Internal Medicine

## 2021-12-21 ENCOUNTER — Telehealth: Payer: Self-pay | Admitting: Internal Medicine

## 2021-12-21 ENCOUNTER — Other Ambulatory Visit: Payer: Self-pay | Admitting: Internal Medicine

## 2021-12-21 NOTE — Telephone Encounter (Signed)
Pt c/o medication issue:  1. Name of Medication: flecainide (TAMBOCOR) 50 MG tablet  2. How are you currently taking this medication (dosage and times per day)? As written  3. Are you having a reaction (difficulty breathing--STAT)? No   4. What is your medication issue? Patient is currently out of medication. Please send in a new prescription to  [id number] - [place], [place] asap.

## 2021-12-21 NOTE — Telephone Encounter (Signed)
Pt's medication was already sent to pt's pharmacy as requested. Confirmation received.  

## 2021-12-21 NOTE — Telephone Encounter (Signed)
Pt's medication was sent to pt's pharmacy as requested. Confirmation received.  °

## 2022-01-12 ENCOUNTER — Other Ambulatory Visit: Payer: Self-pay | Admitting: Internal Medicine

## 2022-01-18 ENCOUNTER — Other Ambulatory Visit: Payer: Self-pay

## 2022-01-18 ENCOUNTER — Telehealth: Payer: Self-pay | Admitting: Internal Medicine

## 2022-01-18 MED ORDER — FLECAINIDE ACETATE 50 MG PO TABS: 50.0000 mg | ORAL_TABLET | Freq: Two times a day (BID) | ORAL | 0 refills | Status: DC

## 2022-01-18 NOTE — Telephone Encounter (Signed)
Pt's medication was sent to pt's pharmacy as requested, asking pt to make overdue appt with Dr. Ladona Ridgel. Confirmation received.

## 2022-01-18 NOTE — Telephone Encounter (Signed)
°*  STAT* If patient is at the pharmacy, call can be transferred to refill team.   1. Which medications need to be refilled? (please list name of each medication and dose if known) flecainide (TAMBOCOR) 50 MG tablet  2. Which pharmacy/location (including street and city if local pharmacy) is medication to be sent to? [id number] [place], [place] - [address]  3. Do they need a 30 day or 90 day supply? 90 day  Patient is out of medication

## 2022-01-28 ENCOUNTER — Telehealth: Payer: Self-pay | Admitting: Internal Medicine

## 2022-01-28 MED ORDER — FLECAINIDE ACETATE 50 MG PO TABS: 50.0000 mg | ORAL_TABLET | Freq: Two times a day (BID) | ORAL | 0 refills | Status: DC

## 2022-01-28 NOTE — Telephone Encounter (Signed)
°*  STAT* If patient is at the pharmacy, call can be transferred to refill team.   1. Which medications need to be refilled? (please list name of each medication and dose if known)  flecainide (TAMBOCOR) 50 MG tablet  2. Which pharmacy/location (including street and city if local pharmacy) is medication to be sent to? [id number] [place], [place] - [address]  3. Do they need a 30 day or 90 day supply?  30 day supply  Patient is scheduled for 3/24 with [place], [place].

## 2022-01-28 NOTE — Telephone Encounter (Signed)
Pt's medication was sent to pt's pharmacy as requested. Confirmation received.  °

## 2022-02-04 ENCOUNTER — Other Ambulatory Visit: Payer: Self-pay | Admitting: Internal Medicine

## 2022-02-04 NOTE — Telephone Encounter (Signed)
?*  STAT* If patient is at the pharmacy, call can be transferred to refill team. ? ? ?1. Which medications need to be refilled? (please list name of each medication and dose if known) Flecainide ? ?2. Which pharmacy/location (including street and city if local pharmacy) is medication to be sent to?[facility] [address] [place], ? ?3. Do they need a 30 day or 90 day supply? 30 days- please call today if possible please- will be out of medicine ? ?

## 2022-02-22 ENCOUNTER — Ambulatory Visit: Payer: 59 | Admitting: Physician Assistant

## 2022-02-22 ENCOUNTER — Other Ambulatory Visit: Payer: Self-pay

## 2022-02-22 ENCOUNTER — Encounter: Payer: Self-pay | Admitting: *Deleted

## 2022-02-22 ENCOUNTER — Encounter: Payer: Self-pay | Admitting: Physician Assistant

## 2022-02-22 VITALS — BP 96/70 | HR 96 | Ht 65.0 in | Wt 159.4 lb

## 2022-02-22 DIAGNOSIS — Z5181 Encounter for therapeutic drug level monitoring: Secondary | ICD-10-CM

## 2022-02-22 DIAGNOSIS — I471 Supraventricular tachycardia: Secondary | ICD-10-CM

## 2022-02-22 DIAGNOSIS — R079 Chest pain, unspecified: Secondary | ICD-10-CM

## 2022-02-22 DIAGNOSIS — Z79899 Other long term (current) drug therapy: Secondary | ICD-10-CM

## 2022-02-22 MED ORDER — FLECAINIDE ACETATE 50 MG PO TABS: 50.0000 mg | ORAL_TABLET | Freq: Two times a day (BID) | ORAL | 3 refills | Status: DC

## 2022-02-22 MED ORDER — METOPROLOL TARTRATE 25 MG PO TABS: 25.0000 mg | ORAL_TABLET | Freq: Every day | ORAL | 1 refills | Status: DC | PRN

## 2022-02-22 NOTE — Progress Notes (Addendum)
? ?Cardiology Office Note ?Date:  02/22/2022  ?Patient ID:  Katie Howard, DOB 03-10-1976, MRN [id number] ?PCP:  Dorisann Frames, MD  ?Electrophysiologist: Dr. Ladona Ridgel ? ?  ?Chief Complaint:  annual visit ? ?History of Present Illness: ?Katie Howard is a 46 y.o. female with history of SVT, hypothyroidism ? ?She comes in today to be seen for dr. Ladona Ridgel, last seen by him Jan 2022, she was new on flecainide and at that time had not had further SVT. 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 ?He felt repeat ablation would likely result in need for pacer/heart block ? ?TODAY ?She reports doing fairly well ?Worries about having her tachycardia, but has not had it since her last ER visit prior to the flecainide. ?This is somewhat anxiety provoking, often worries most at night that she will have heart racing, but has not, her husband tries to reassure her but she would like a strategy should it happen something she can do. ?She has an infrequent L upper arm ache, it is infrequent, can last 10-12 hours, no changes by movement or exertional, no associated symptoms with it. ?Usually when she has it, will go to bed with it and is gone when she wakes. ?NO SOB ?No near syncope or syncope ? ? ?AAD Hx ?propafenone stopped 2/2 metallic taste  ?Flecainide started Nov 2021 ? ? ?Past Medical History:  ?Diagnosis Date  ? Anemia   ? Unspecified  ? Anxiety   ? Hypothyroidism   ? Patent ductus arteriosus 2010  ? suspected per echocardiogram  ? SVT (supraventricular tachycardia) (HCC)   ? OnRythmol; failed to tolerate flecainide  ? ? ?Past Surgical History:   ?Procedure Laterality Date  ? ABLATION  03/02/2014  ? AVNRT ablation of multiple slow pathways by Dr Ladona Ridgel  ? SUPRAVENTRICULAR TACHYCARDIA ABLATION N/A 03/02/2014  ? Procedure: SUPRAVENTRICULAR TACHYCARDIA ABLATION;  Surgeon: Marinus Maw, MD;  Location: [facility] CATH LAB;  Service: Cardiovascular;  Laterality: N/A;  ? ? ?Current Outpatient Medications  ?Medication Sig Dispense Refill  ? flecainide (TAMBOCOR) 50 MG tablet Take 1 tablet (50 mg total) by mouth 2 (two) times daily. 60 tablet 0  ? SYNTHROID 125 MCG tablet Take 125 mcg by mouth daily.    ? ?No current facility-administered medications for this visit.  ? ? ?Allergies:   Sulfonamide derivatives  ? ?Social History:  The patient  reports that she has never smoked. She has never used smokeless tobacco. She reports that she does not drink alcohol and does not use drugs.  ? ?Family History:  The patient's family history includes Healthy in her sister and sister; Hypertension in her father and mother. ? ?ROS:  Please see the history of present illness.    ?All other systems are reviewed and otherwise negative.  ? ?PHYSICAL EXAM:  ?VS:  There were no vitals taken for this visit. BMI: There is no height or weight on file to calculate BMI. ?Well nourished, well developed, in no acute distress ?HEENT: normocephalic, atraumatic ?Neck: no JVD, carotid bruits or masses ?Cardiac:  RRR; no significant murmurs, no rubs, or gallops ?Lungs:  CTA b/l, no wheezing,  rhonchi or rales ?Abd: soft, nontender ?MS: no deformity or atrophy ?Ext:  no edema ?Skin: warm and dry, no rash ?Neuro:  No gross deficits appreciated ?Psych: euthymic mood, full affect ? ? ?EKG:  Done today and reviewed by myself shows  ?SR 96bpm, PR 192, QRS 88, QTc 432 ? ? ?09/08/2020: stress myoview ?The left ventricular ejection fraction is normal (55-65%). ?Nuclear stress EF: 60%. ?There was no ST segment deviation noted during stress. ?T wave inversion was noted during stress in the II, III, aVF, V3, V4, V5 and  V6 leads. ?Defect 1: There is a small defect of moderate severity present in the apex location. ?This is a low risk study. ?  ?There is a small fixed apical defect. High suspicion for artifact, as wall motion is normal in this area. No evidence of ischemia on imaging, but T wave inversions noted during infusion of lexiscan. ? ? ? ? ?08/08/2016: TTE ?- Left ventricle: The cavity size was normal. Wall thickness was  ?  normal. Systolic function was normal. The estimated ejection  ?  fraction was in the range of 55% to 60%. Wall motion was normal;  ?  there were no regional wall motion abnormalities. Left  ?  ventricular diastolic function parameters were normal.  ?- Aortic valve: There was no stenosis. There was mild  ?  regurgitation.  ?- Mitral valve: There was no significant regurgitation.  ?- Right ventricle: The cavity size was normal. Systolic function  ?  was normal.  ?- Pulmonary arteries: No complete TR doppler jet so unable to  ?  estimate PA systolic pressure.  ?- Inferior vena cava: The vessel was normal in size. The  ?  respirophasic diameter changes were in the normal range (= 50%),  ?  consistent with normal central venous pressure.  ? ?Impressions:  ? ?- Normal study except for mild aortic insufficiency.  ? ?Recent Labs: ?No results found for requested labs within last 8760 hours.  ?No results found for requested labs within last 8760 hours.  ? ?CrCl cannot be calculated (Patient's most recent lab result is older than the maximum 21 days allowed.).  ? ?Wt Readings from Last 3 Encounters:  ?12/08/20 162 lb 3.2 oz (73.6 kg)  ?10/18/20 152 lb (68.9 kg)  ?10/14/20 157 lb (71.2 kg)  ?  ? ?Other studies reviewed: ?Additional studies/records reviewed today include: summarized above ? ?ASSESSMENT AND PLAN: ? ?SVT ?Flecainide ?Stable intervals ? ?Discussed adding a small dose of daily nodal blocker but she is very resistant to taking another medication every day, a recheck on her BP is 100/60 and I think she  could tolerate a small dose BB, but she is un agreeable. ?Plan for lopressor 25mg  one, prn daily for tachycardia/SVT and ER if not resolved  ? ?She is young, certain that she is not pregnant, discussed importance given medicines ? ?LU discomfort seems atypical  ?I do not see a EST since starting flecainide (she has had one previous to it), and will plan for this for both reasons.  She is familiar with stress test and is agreeable ? ?Disposition: F/u with otherwis ein 70mo, sooner if needed ? ?Current medicines are reviewed at length with the patient today.  The patient did not have any concerns regarding medicines. ? ?Signed, ?5mo, PA-C ?02/22/2022 5:59 AM    ? ?[facility] ?[address] ?[address] ?[place] ?[phone] (office)  ?[phone] (fax) ? ? ?

## 2022-02-22 NOTE — Patient Instructions (Signed)
Medication Instructions:  ? ?START TAKING LOPRESSOR 25 MG AS NEEDED FOR PALPITATIONS OR TACHYCARDIA ? ? ?*If you need a refill on your cardiac medications before your next appointment, please call your pharmacy* ? ? ?Lab Work: [place] ? ? ?If you have labs (blood work) drawn today and your tests are completely normal, you will receive your results only by: ?MyChart Message (if you have MyChart) OR ?A paper copy in the mail ?If you have any lab test that is abnormal or we need to change your treatment, we will call you to review the results. ? ? ?Testing/Procedures:Your physician has requested that you have en exercise stress myoview. For further information please visit [email or web address]. Please follow instruction sheet, as given. ? ? ? ?Follow-Up: ?At [facility], you and your health needs are our priority.  As part of our continuing mission to provide you with exceptional heart care, we have created designated Provider Care Teams.  These Care Teams include your primary Cardiologist (physician) and Advanced Practice Providers (APPs -  Physician Assistants and Nurse Practitioners) who all work together to provide you with the care you need, when you need it. ? ?We recommend signing up for the patient portal called "MyChart".  Sign up information is provided on this After Visit Summary.  MyChart is used to connect with patients for Virtual Visits (Telemedicine).  Patients are able to view lab/test results, encounter notes, upcoming appointments, etc.  Non-urgent messages can be sent to your provider as well.   ?To learn more about what you can do with MyChart, go to [email or web address].   ? ?Your next appointment:   ?6 month(s) ? ?The format for your next appointment:   ?In Person ? ?Provider:   ?You may see Cristopher Peru, MD or one of the following Advanced Practice Providers on your designated Care Team:   ?Tommye Standard, PA-C ?Legrand Como "Jonni Sanger" Warner, PA-C  ? ? ?Other Instructions ? ?

## 2022-03-06 ENCOUNTER — Telehealth (HOSPITAL_COMMUNITY): Payer: Self-pay | Admitting: *Deleted

## 2022-03-06 NOTE — Telephone Encounter (Signed)
Patient given detailed instructions per Myocardial Perfusion Study Information Sheet for the test on 03/12/22 Patient notified to arrive 15 minutes early and that it is imperative to arrive on time for appointment to keep from having the test rescheduled. ? If you need to cancel or reschedule your appointment, please call the office within 24 hours of your appointment. . Patient verbalized understanding.  Katie Howard ? ? ? ?

## 2022-03-13 ENCOUNTER — Ambulatory Visit (HOSPITAL_COMMUNITY): Payer: 59

## 2022-03-15 ENCOUNTER — Telehealth (HOSPITAL_COMMUNITY): Payer: Self-pay | Admitting: *Deleted

## 2022-03-15 NOTE — Telephone Encounter (Signed)
Patient given detailed instructions per Myocardial Perfusion Study Information Sheet for the test on 03/22/2022 at 7:30. Patient notified to arrive 15 minutes early and that it is imperative to arrive on time for appointment to keep from having the test rescheduled. ? If you need to cancel or reschedule your appointment, please call the office within 24 hours of your appointment. . Patient verbalized understanding.Katie Howard ? ? ?

## 2022-03-21 ENCOUNTER — Encounter (HOSPITAL_COMMUNITY): Payer: 59

## 2022-03-22 ENCOUNTER — Ambulatory Visit (HOSPITAL_COMMUNITY): Payer: 59 | Attending: Internal Medicine

## 2022-03-22 DIAGNOSIS — R079 Chest pain, unspecified: Secondary | ICD-10-CM | POA: Diagnosis present

## 2022-03-22 LAB — MYOCARDIAL PERFUSION IMAGING
Angina Index: 0
Duke Treadmill Score: 6
Estimated workload: 7.3
Exercise duration (min): 6 min
Exercise duration (sec): 0 s
LV dias vol: 51 mL (ref 46–106)
LV sys vol: 21 mL
MPHR: 175 {beats}/min
Nuc Stress EF: 60 %
Peak HR: 171 {beats}/min
Percent HR: 97 %
Rest HR: 76 {beats}/min
Rest Nuclear Isotope Dose: 9.8 mCi
SDS: 2
SRS: 2
SSS: 4
ST Depression (mm): 0 mm
Stress Nuclear Isotope Dose: 31.7 mCi
TID: 0.8

## 2022-03-22 MED ORDER — TECHNETIUM TC 99M TETROFOSMIN IV KIT
9.8000 | PACK | Freq: Once | INTRAVENOUS | Status: AC | PRN
  Administered 2022-03-22: 9.8 via INTRAVENOUS
  Filled 2022-03-22: qty 10

## 2022-03-22 MED ORDER — TECHNETIUM TC 99M TETROFOSMIN IV KIT
31.7000 | PACK | Freq: Once | INTRAVENOUS | Status: AC | PRN
  Administered 2022-03-22: 31.7 via INTRAVENOUS
  Filled 2022-03-22: qty 32

## 2022-08-13 ENCOUNTER — Ambulatory Visit: Payer: 59 | Attending: Internal Medicine | Admitting: Internal Medicine

## 2022-08-13 ENCOUNTER — Encounter: Payer: Self-pay | Admitting: Internal Medicine

## 2022-08-13 VITALS — BP 100/65 | HR 83 | Ht 64.0 in | Wt 160.0 lb

## 2022-08-13 DIAGNOSIS — I471 Supraventricular tachycardia: Secondary | ICD-10-CM

## 2022-08-13 NOTE — Patient Instructions (Signed)

## 2022-08-13 NOTE — Progress Notes (Signed)
HPI Katie Howard returns today for followup. He is a pleasant 46 yo woman with SVT s/p catheter ablation remotely, who has had recurrent episodes of SVT. The worst of which was over a year ago. Since then she has been stable. She states that at that time she was under worsening stress which has improved. She has been on low dose of flecainide and as needed metoprolol. She has not had any heart racing.  Allergies  Allergen Reactions   Sulfonamide Derivatives Swelling     Current Outpatient Medications  Medication Sig Dispense Refill   flecainide (TAMBOCOR) 50 MG tablet Take 1 tablet (50 mg total) by mouth 2 (two) times daily. 180 tablet 3   metoprolol tartrate (LOPRESSOR) 25 MG tablet Take 1 tablet (25 mg total) by mouth daily as needed (palpitations or tachycardia). 90 tablet 1   Multiple Vitamins-Minerals (MULTI FOR HER) TABS See admin instructions.     SYNTHROID 125 MCG tablet Take 125 mcg by mouth daily.     No current facility-administered medications for this visit.     Past Medical History:  Diagnosis Date   Anemia    Unspecified   Anxiety    Hypothyroidism    Patent ductus arteriosus 2010   suspected per echocardiogram   SVT (supraventricular tachycardia) (HCC)    OnRythmol; failed to tolerate flecainide    ROS:   All systems reviewed and negative except as noted in the HPI.   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 Doctor TACHYCARDIA ABLATION N/A 03/02/2014   Procedure: SUPRAVENTRICULAR TACHYCARDIA ABLATION;  Surgeon: Marinus Maw, MD;  Location: [facility] CATH LAB;  Service: Cardiovascular;  Laterality: N/A;     Family History  Problem Relation Age of Onset   Hypertension Mother    Hypertension Father    Healthy Sister    Healthy Sister      Social History   Socioeconomic History   Marital status: Married    Spouse name: Not on file   Number of  children: Not on file   Years of education: Not on file   Highest education level: Not on file  Occupational History   Not on file  Tobacco Use   Smoking status: Never   Smokeless tobacco: Never  Substance and Sexual Activity   Alcohol use: No   Drug use: No   Sexual activity: Not on file  Other Topics Concern   Not on file  Social History Narrative   Not on file   Social Determinants of Health   Financial Resource Strain: Not on file  Food Insecurity: Not on file  Transportation Needs: Not on file  Physical Activity: Not on file  Stress: Not on file  Social Connections: Not on file  Intimate Partner Violence: Not on file     Pulse 83   Ht 5\' 4"  (1.626 m)   Wt 160 lb (72.6 kg)   BMI 27.46 kg/m   Physical Exam:  Well appearing NAD HEENT: Unremarkable Neck:  No JVD, no thyromegally Lymphatics:  No adenopathy Back:  No CVA tenderness Lungs:  Clear with no wheezes HEART:  Regular rate rhythm, no murmurs, no rubs, no clicks Abd:  soft, positive bowel sounds, no organomegally, no rebound, no guarding Ext:  2 plus pulses, no edema, no cyanosis, no clubbing Skin:  No rashes no nodules Neuro:  CN II through XII intact,  motor grossly intact  EKG - nsr  DEVICE  Normal device function.  See PaceArt for details.   Assess/Plan:   1. SVT - I have discussed the treatment options with the patient. She would like to continue medical therapy. She had a slow pathway modification previously and we ablated very close to her AV node and also ablated on the left side of Koch's triangle. She will continue the flecainide and as needed metoprolol. We will hold off on repeat ablation. 2. Weight gain - her weight is down 2 lbs from her last visit.    Katie Gowda Kinzlie Harney,MD

## 2023-03-03 ENCOUNTER — Other Ambulatory Visit: Payer: Self-pay | Admitting: Physician Assistant

## 2023-04-21 ENCOUNTER — Telehealth: Payer: Self-pay | Admitting: Internal Medicine

## 2023-04-21 DIAGNOSIS — I471 Supraventricular tachycardia, unspecified: Secondary | ICD-10-CM

## 2023-04-21 MED ORDER — METOPROLOL TARTRATE 25 MG PO TABS: 25.0000 mg | ORAL_TABLET | Freq: Every day | ORAL | 0 refills | Status: DC | PRN

## 2023-04-21 NOTE — Telephone Encounter (Signed)
Pt called per message received from [facility] T[facility].   On Sunday 5/5, Pt stated she had an episode of SVT.  Her husband called EMS, her HR was between 212 and 220 bpm.  Adenosine was administered.  Pt  HR returned to WNL.   Pt stated she takes Flecainide 50 mg, BID; and took an extra tablet when this occurred.  Pt stated her Lopressor 25 mg, PRN tachycardia, had expired, and needs another script sent into her pharmacy.  Message sent to Ms. Francis Dowse PA-C for refill request of Lopressor.    Per Ms. Edson Snowball, Ms. Renee Ok with 1 bottle of Lopressor 25 mg PRN Tachycardia, as long as provider appointment scheduled for follow up.   Ms. Edson Snowball and Ms. Renee PA-C advised, Pt requested an appointment to see provider at [facility] s/p SVT event;  Ms. Luster Landsberg had 825 am appointment open on 5/23.  Lopressor refill will be sent to [facility].

## 2023-04-21 NOTE — Telephone Encounter (Signed)
STAT if HR is under 50 or over 120 (normal HR is 60-100 beats per minute)  What is your heart rate? 220, 200 last week  Do you have a log of your heart rate readings (document readings)?   Do you have any other symptoms? Only high heart rate- patient wants to be seen- she needs to discuss   whether to change medicine, or what other options does she have

## 2023-04-21 NOTE — Progress Notes (Unsigned)
Cardiology Office Note Date:  04/21/2023  Patient ID:  Katie, Howard [birth year], MRN [id number] PCP:  Dorisann Frames, MD  Electrophysiologist: Dr. Ladona Ridgel    Chief Complaint:  *** SVT  History of Present Illness: Katie Howard is a 47 y.o. female with history of SVT, hypothyroidism  She comes in today to be seen for Dr. Ladona Ridgel, last seen by him Jan 2022, she was new on flecainide and at that time had not had further SVT. 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 This is somewhat anxiety provoking, often worries most at night that she will have heart racing, but has not, her husband tries to reassure her but she would like a strategy should it happen something she can do. She has an infrequent L upper arm ache, it is infrequent, can last 10-12 hours, no changes by movement or exertional, no associated symptoms with it. Usually when she has it, will go to bed with it and is gone when she wakes. NO SOB No near syncope or syncope Planned to get exercise stress test on flecainide  Stress test was negative/low risk, no description of development of AVblock/BBB  She saw Dr. Ladona Ridgel 08/13/22, doing well with her flecainide and PRN metoprolol, no changes were made.  Pt called that she had an episode of SVT treated successfully with adenosine by EMS, her metoprolol pills expired and  given a month refill and an appointment.  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 ONLY. 90 tablet 0   Multiple Vitamins-Minerals (MULTI FOR HER) TABS See admin instructions.     SYNTHROID 125 MCG tablet Take 125 mcg by mouth daily.     No current facility-administered medications for this visit.    Allergies:   Sulfonamide derivatives   Social History:  The patient  reports that she has never smoked. She has never used smokeless tobacco. She reports  that she does not drink alcohol and does not use drugs.   Family History:  The patient's family history includes Healthy in her sister and sister; Hypertension in her father and mother.  ROS:  Please see the history of present illness.    All other systems are reviewed and otherwise negative.   PHYSICAL EXAM:  VS:  There were no vitals taken for this visit. BMI: There is no height or weight on file to calculate BMI. Well nourished, well developed, in no acute distress HEENT:  normocephalic, atraumatic Neck: no JVD, carotid bruits or masses Cardiac: *** RRR; no significant murmurs, no rubs, or gallops Lungs: *** CTA b/l, no wheezing, rhonchi or rales Abd: soft, nontender MS: no deformity or atrophy Ext:  *** no edema Skin: warm and dry, no rash Neuro:  No gross deficits appreciated Psych: euthymic mood, full affect   EKG:  Done today and reviewed by myself shows  ***  03/22/22: stress myoview The study is normal. The study is low risk.   No ST deviation was noted.   Left ventricular function is normal. Nuclear stress EF: 60 %. The left ventricular ejection fraction is normal (55-65%). End diastolic cavity size is normal. End systolic cavity size is normal.   Prior study available for comparison from 09/08/2020.   IMPRESSIONS Negative for stress induced arrhythmias.  Negative ECG stress test. Normal left ventricular function. No perfusion defects.    CONCLUSIONS Negative stress test. Low risk study. Apical perfusion defect from 2021 has resolved, consistent with artifact.   09/08/2020: stress myoview The left ventricular ejection fraction is normal (55-65%). Nuclear stress EF: 60%. There was no ST segment deviation noted during stress. T wave inversion was noted during stress in the II, III, aVF, V3, V4, V5 and V6 leads. Defect 1: There is a small defect of moderate severity present in the apex location. This is a low risk study.   There is a small fixed apical defect. High suspicion for artifact, as wall motion is normal in this area. No evidence of ischemia on imaging, but T wave inversions noted during infusion of lexiscan.     08/08/2016: TTE - Left ventricle: The cavity size was normal. Wall thickness was    normal. Systolic function was normal. The estimated ejection    fraction was in the range of 55% to 60%. Wall motion was normal;    there were no regional wall motion abnormalities. Left    ventricular diastolic function parameters  were normal.  - Aortic valve: There was no stenosis. There was mild    regurgitation.  - Mitral valve: There was no significant regurgitation.  - Right ventricle: The cavity size was normal. Systolic function    was normal.  - Pulmonary arteries: No complete TR doppler jet so unable to    estimate PA systolic pressure.  - Inferior vena cava: The vessel was normal in size. The    respirophasic diameter changes were in the normal range (= 50%),    consistent with normal central venous pressure.   Impressions:   - Normal study except for mild aortic insufficiency.   Recent Labs: No results found for requested labs within last 365 days.  No results found for requested labs within last 365 days.   CrCl cannot be calculated (Patient's most recent lab result is older than the maximum 21 days allowed.).   Wt Readings from Last 3 Encounters:  08/13/22 160 lb (72.6 kg)  03/22/22 159 lb (72.1 kg)  02/22/22 159 lb 6.4 oz (72.3 kg)     Other studies reviewed: Additional studies/records reviewed today include: summarized above  ASSESSMENT AND PLAN:  SVT Has had prior slow pathway modification previously, ablated very close to her AV node and also ablated on the left side of Koch's triangle  *** Flecainide *** Stable intervals   Disposition: ***    Current medicines are reviewed at length with the patient today.  The patient did not have any concerns regarding medicines.  Norma Fredrickson, PA-C 04/21/2023 3:04 PM     [facility] [address] [place] [address] [phone] (office)  [phone] (fax)

## 2023-04-24 ENCOUNTER — Ambulatory Visit: Payer: 59 | Attending: Physician Assistant | Admitting: Physician Assistant

## 2023-04-24 ENCOUNTER — Encounter: Payer: Self-pay | Admitting: Physician Assistant

## 2023-04-24 VITALS — BP 104/60 | HR 74 | Ht 64.0 in | Wt 156.4 lb

## 2023-04-24 DIAGNOSIS — I471 Supraventricular tachycardia, unspecified: Secondary | ICD-10-CM

## 2023-04-24 DIAGNOSIS — Z5181 Encounter for therapeutic drug level monitoring: Secondary | ICD-10-CM

## 2023-04-24 DIAGNOSIS — Z79899 Other long term (current) drug therapy: Secondary | ICD-10-CM

## 2023-04-24 NOTE — Patient Instructions (Addendum)
Medication Instructions:   Your physician recommends that you continue on your current medications as directed. Please refer to the Current Medication list given to you today.   *If you need a refill on your cardiac medications before your next appointment, please call your pharmacy*   Lab Work:  BMET MAG CBC TODAY     If you have labs (blood work) drawn today and your tests are completely normal, you will receive your results only by: MyChart Message (if you have MyChart) OR A paper copy in the mail If you have any lab test that is abnormal or we need to change your treatment, we will call you to review the results.   Testing/Procedures: NONE ORDERED  TODAY    Follow-Up: At [facility], you and your health needs are our priority.  As part of our continuing mission to provide you with exceptional heart care, we have created designated Provider Care Teams.  These Care Teams include your primary Cardiologist (physician) and Advanced Practice Providers (APPs -  Physician Assistants and Nurse Practitioners) who all work together to provide you with the care you need, when you need it.  We recommend signing up for the patient portal called "MyChart".  Sign up information is provided on this After Visit Summary.  MyChart is used to connect with patients for Virtual Visits (Telemedicine).  Patients are able to view lab/test results, encounter notes, upcoming appointments, etc.  Non-urgent messages can be sent to your provider as well.   To learn more about what you can do with MyChart, go to [email or web address].    Your next appointment:   4 month(s)  Provider:    Lewayne Bunting, MD    Other Instructions

## 2023-04-25 LAB — CBC
Hematocrit: 36.9 % (ref 34.0–46.6)
Hemoglobin: 12.1 g/dL (ref 11.1–15.9)
MCH: 28.1 pg (ref 26.6–33.0)
MCHC: 32.8 g/dL (ref 31.5–35.7)
MCV: 86 fL (ref 79–97)
Platelets: 323 10*3/uL (ref 150–450)
RBC: 4.3 x10E6/uL (ref 3.77–5.28)
RDW: 12.9 % (ref 11.7–15.4)
WBC: 6.4 10*3/uL (ref 3.4–10.8)

## 2023-04-25 LAB — BASIC METABOLIC PANEL
BUN/Creatinine Ratio: 11 (ref 9–23)
BUN: 8 mg/dL (ref 6–24)
CO2: 23 mmol/L (ref 20–29)
Calcium: 8.9 mg/dL (ref 8.7–10.2)
Chloride: 101 mmol/L (ref 96–106)
Creatinine, Ser: 0.76 mg/dL (ref 0.57–1.00)
Glucose: 107 mg/dL — ABNORMAL HIGH (ref 70–99)
Potassium: 4.1 mmol/L (ref 3.5–5.2)
Sodium: 137 mmol/L (ref 134–144)
eGFR: 98 mL/min/{1.73_m2} (ref >59)

## 2023-04-25 LAB — MAGNESIUM: Magnesium: 1.8 mg/dL (ref 1.6–2.3)

## 2023-07-17 ENCOUNTER — Other Ambulatory Visit: Payer: Self-pay | Admitting: Physician Assistant

## 2023-07-17 DIAGNOSIS — I471 Supraventricular tachycardia, unspecified: Secondary | ICD-10-CM

## 2023-08-19 ENCOUNTER — Encounter: Payer: Self-pay | Admitting: Internal Medicine

## 2023-08-19 ENCOUNTER — Ambulatory Visit: Payer: 59 | Attending: Internal Medicine | Admitting: Internal Medicine

## 2023-08-19 VITALS — BP 132/80 | HR 73 | Ht 64.0 in | Wt 160.6 lb

## 2023-08-19 DIAGNOSIS — I471 Supraventricular tachycardia, unspecified: Secondary | ICD-10-CM

## 2023-08-19 NOTE — Progress Notes (Signed)
HPI Katie Howard returns today for followup. He is a pleasant 47 yo woman with SVT s/p catheter ablation remotely, who has had recurrent episodes of SVT. The patient last had an episode in May, 4 months ago.Since then she has been stable. She states that at that time she was under worsening stress which has improved. Her husband was out of town. She has been on low dose of flecainide and as needed metoprolol. She has not had any heart racing since here episode of May. Allergies  Allergen Reactions   Sulfonamide Derivatives Swelling     Current Outpatient Medications  Medication Sig Dispense Refill   flecainide (TAMBOCOR) 50 MG tablet TAKE 1 TABLET BY MOUTH TWICE A DAY 180 tablet 1   metoprolol tartrate (LOPRESSOR) 25 MG tablet TAKE 1 TABLET (25 MG TOTAL) BY MOUTH DAILY AS NEEDED (PALPITATIONS OR TACHYCARDIA). ONLY. 90 tablet 3   SYNTHROID 125 MCG tablet Take 125 mcg by mouth daily.     No current facility-administered medications for this visit.     Past Medical History:  Diagnosis Date   Anemia    Unspecified   Anxiety    Hypothyroidism    Patent ductus arteriosus 2010   suspected per echocardiogram   SVT (supraventricular tachycardia)    OnRythmol; failed to tolerate flecainide    ROS:   All systems reviewed and negative except as noted in the HPI.   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 Doctor TACHYCARDIA ABLATION N/A 03/02/2014   Procedure: SUPRAVENTRICULAR TACHYCARDIA ABLATION;  Surgeon: Marinus Maw, MD;  Location: [facility] CATH LAB;  Service: Cardiovascular;  Laterality: N/A;     Family History  Problem Relation Age of Onset   Hypertension Mother    Hypertension Father    Healthy Sister    Healthy Sister      Social History   Socioeconomic History   Marital status: Married    Spouse name: Not on file   Number of children: Not on file   Years of  education: Not on file   Highest education level: Not on file  Occupational History   Not on file  Tobacco Use   Smoking status: Never   Smokeless tobacco: Never  Substance and Sexual Activity   Alcohol use: No   Drug use: No   Sexual activity: Not on file  Other Topics Concern   Not on file  Social History Narrative   Not on file   Social Determinants of Health   Financial Resource Strain: Not on file  Food Insecurity: Not on file  Transportation Needs: Not on file  Physical Activity: Not on file  Stress: Not on file  Social Connections: Not on file  Intimate Partner Violence: Not on file     BP 132/80   Pulse 73   Ht 5\' 4"  (1.626 m)   Wt 160 lb 9.6 oz (72.8 kg)   SpO2 99%   BMI 27.57 kg/m   Physical Exam:  Well appearing NAD HEENT: Unremarkable Neck:  No JVD, no thyromegally Lymphatics:  No adenopathy Back:  No CVA tenderness Lungs:  Clear with no wheezes HEART:  Regular rate rhythm, no murmurs, no rubs, no clicks Abd:  soft, positive bowel sounds, no organomegally, no rebound, no guarding Ext:  2 plus pulses, no edema, no cyanosis, no clubbing Skin:  No rashes no nodules Neuro:  CN  II through XII intact, motor grossly intact  EKG - NSR with NSTW abnormality.    Assess/Plan:  1. SVT - I have discussed the treatment options with the patient. She would like to continue medical therapy. She had a slow pathway modification previously and we ablated very close to her AV node and also ablated on the left side of Koch's triangle. She will continue the flecainide and as needed metoprolol. We will hold off on repeat ablation. 2. Weight gain - her weight is unchanged from a year ago.    Katie Howard Katie Cherian,MD

## 2023-08-19 NOTE — Patient Instructions (Signed)
Medication Instructions:  Your physician recommends that you continue on your current medications as directed. Please refer to the Current Medication list given to you today.  *If you need a refill on your cardiac medications before your next appointment, please call your pharmacy*  Lab Work: None ordered.  If you have labs (blood work) drawn today and your tests are completely normal, you will receive your results only by: MyChart Message (if you have MyChart) OR A paper copy in the mail If you have any lab test that is abnormal or we need to change your treatment, we will call you to review the results.  Testing/Procedures: None ordered.  Follow-Up: At [facility], you and your health needs are our priority.  As part of our continuing mission to provide you with exceptional heart care, we have created designated Provider Care Teams.  These Care Teams include your primary Cardiologist (physician) and Advanced Practice Providers (APPs -  Physician Assistants and Nurse Practitioners) who all work together to provide you with the care you need, when you need it.  Your next appointment:   1 year(s)  The format for your next appointment:   In Person  Provider:   Lewayne Bunting, MD{or one of the following Advanced Practice Providers on your designated Care Team:   Francis Dowse, [place] Casimiro Needle "Mardelle Matte" [place], [place] Earnest Rosier, NP   Important Information About Sugar

## 2023-08-29 ENCOUNTER — Other Ambulatory Visit: Payer: Self-pay | Admitting: Internal Medicine

## 2024-08-18 ENCOUNTER — Other Ambulatory Visit: Payer: Self-pay | Admitting: Internal Medicine

## 2024-08-18 MED ORDER — FLECAINIDE ACETATE 50 MG PO TABS: 50.0000 mg | ORAL_TABLET | Freq: Two times a day (BID) | ORAL | 0 refills | Status: DC

## 2024-08-25 ENCOUNTER — Other Ambulatory Visit: Payer: Self-pay | Admitting: Internal Medicine

## 2024-09-17 ENCOUNTER — Other Ambulatory Visit: Payer: Self-pay | Admitting: Internal Medicine

## 2024-12-19 ENCOUNTER — Other Ambulatory Visit: Payer: Self-pay | Admitting: Internal Medicine

## 2024-12-22 NOTE — Telephone Encounter (Signed)
 Labs on 07/21/24 [facility]
